# Patient Record
Sex: Female | Born: 1970 | Race: White | Hispanic: No | Smoking: Former smoker
Health system: Southern US, Community
[De-identification: ages and names within clinical notes are randomized; demographics above are authoritative.]

## PROBLEM LIST (undated history)

## (undated) DIAGNOSIS — R42 Dizziness and giddiness: Secondary | ICD-10-CM

## (undated) DIAGNOSIS — J449 Chronic obstructive pulmonary disease, unspecified: Secondary | ICD-10-CM

## (undated) DIAGNOSIS — I82419 Acute embolism and thrombosis of unspecified femoral vein: Secondary | ICD-10-CM

## (undated) DIAGNOSIS — I2699 Other pulmonary embolism without acute cor pulmonale: Secondary | ICD-10-CM

## (undated) HISTORY — PX: CHOLECYSTECTOMY: SHX55

## (undated) HISTORY — DX: Acute embolism and thrombosis of unspecified femoral vein: I82.419

## (undated) HISTORY — DX: Other pulmonary embolism without acute cor pulmonale: I26.99

## (undated) HISTORY — PX: OTHER SURGICAL HISTORY: SHX169

---

## 2002-02-24 DIAGNOSIS — I82419 Acute embolism and thrombosis of unspecified femoral vein: Secondary | ICD-10-CM

## 2002-02-24 HISTORY — DX: Acute embolism and thrombosis of unspecified femoral vein: I82.419

## 2011-04-02 ENCOUNTER — Emergency Department: Payer: Self-pay | Admitting: Emergency Medicine

## 2011-04-02 LAB — PROTIME-INR
INR: 0.9
Prothrombin Time: 12.1 secs (ref 11.5–14.7)

## 2011-04-02 LAB — APTT: Activated PTT: 27.5 secs (ref 23.6–35.9)

## 2013-03-29 ENCOUNTER — Emergency Department: Payer: Self-pay | Admitting: Emergency Medicine

## 2013-03-29 LAB — PROTIME-INR
INR: 1.5
Prothrombin Time: 17.6 secs — ABNORMAL HIGH (ref 11.5–14.7)

## 2013-03-29 LAB — COMPREHENSIVE METABOLIC PANEL
ALK PHOS: 78 U/L
ANION GAP: 8 (ref 7–16)
Albumin: 3.6 g/dL (ref 3.4–5.0)
BUN: 9 mg/dL (ref 7–18)
Bilirubin,Total: 0.5 mg/dL (ref 0.2–1.0)
CHLORIDE: 107 mmol/L (ref 98–107)
Calcium, Total: 8.3 mg/dL — ABNORMAL LOW (ref 8.5–10.1)
Co2: 23 mmol/L (ref 21–32)
Creatinine: 0.69 mg/dL (ref 0.60–1.30)
EGFR (African American): >60
Glucose: 93 mg/dL (ref 65–99)
OSMOLALITY: 274 (ref 275–301)
POTASSIUM: 3.7 mmol/L (ref 3.5–5.1)
SGOT(AST): 28 U/L (ref 15–37)
SGPT (ALT): 41 U/L (ref 12–78)
SODIUM: 138 mmol/L (ref 136–145)
Total Protein: 7.4 g/dL (ref 6.4–8.2)

## 2013-03-29 LAB — CBC
HCT: 38.7 % (ref 35.0–47.0)
HGB: 12.8 g/dL (ref 12.0–16.0)
MCH: 29 pg (ref 26.0–34.0)
MCHC: 33.1 g/dL (ref 32.0–36.0)
MCV: 88 fL (ref 80–100)
PLATELETS: 276 10*3/uL (ref 150–440)
RBC: 4.41 10*6/uL (ref 3.80–5.20)
RDW: 14.2 % (ref 11.5–14.5)
WBC: 7.2 10*3/uL (ref 3.6–11.0)

## 2013-03-29 LAB — TROPONIN I
Troponin-I: <0.02 ng/mL
Troponin-I: <0.02 ng/mL

## 2013-06-24 HISTORY — PX: TOE SURGERY: SHX1073

## 2013-07-29 ENCOUNTER — Inpatient Hospital Stay: Payer: Self-pay | Admitting: Internal Medicine

## 2013-07-29 LAB — TSH: Thyroid Stimulating Horm: 2.08 u[IU]/mL

## 2013-07-29 LAB — BASIC METABOLIC PANEL
Anion Gap: 6 — ABNORMAL LOW (ref 7–16)
BUN: 7 mg/dL (ref 7–18)
Calcium, Total: 8.5 mg/dL (ref 8.5–10.1)
Chloride: 106 mmol/L (ref 98–107)
Co2: 27 mmol/L (ref 21–32)
Creatinine: 0.76 mg/dL (ref 0.60–1.30)
EGFR (African American): >60
EGFR (Non-African Amer.): >60
GLUCOSE: 95 mg/dL (ref 65–99)
Osmolality: 275 (ref 275–301)
Potassium: 3.9 mmol/L (ref 3.5–5.1)
SODIUM: 139 mmol/L (ref 136–145)

## 2013-07-29 LAB — CBC WITH DIFFERENTIAL/PLATELET
BASOS ABS: 0.1 10*3/uL (ref 0.0–0.1)
Basophil %: 1 %
EOS PCT: 2.5 %
Eosinophil #: 0.1 10*3/uL (ref 0.0–0.7)
HCT: 37.6 % (ref 35.0–47.0)
HGB: 12.4 g/dL (ref 12.0–16.0)
LYMPHS ABS: 1.4 10*3/uL (ref 1.0–3.6)
LYMPHS PCT: 27.4 %
MCH: 29.4 pg (ref 26.0–34.0)
MCHC: 32.8 g/dL (ref 32.0–36.0)
MCV: 90 fL (ref 80–100)
Monocyte #: 0.4 x10 3/mm (ref 0.2–0.9)
Monocyte %: 7.9 %
Neutrophil #: 3.2 10*3/uL (ref 1.4–6.5)
Neutrophil %: 61.2 %
PLATELETS: 298 10*3/uL (ref 150–440)
RBC: 4.2 10*6/uL (ref 3.80–5.20)
RDW: 14.4 % (ref 11.5–14.5)
WBC: 5.2 10*3/uL (ref 3.6–11.0)

## 2013-07-29 LAB — PROTIME-INR
INR: 2.1
PROTHROMBIN TIME: 23.2 s — AB (ref 11.5–14.7)

## 2013-07-29 LAB — MAGNESIUM: MAGNESIUM: 2 mg/dL

## 2013-07-29 LAB — HEMOGLOBIN A1C: Hemoglobin A1C: 6.4 % — ABNORMAL HIGH (ref 4.2–6.3)

## 2013-07-29 LAB — APTT: ACTIVATED PTT: 55.3 s — AB (ref 23.6–35.9)

## 2013-07-30 LAB — BASIC METABOLIC PANEL
ANION GAP: 5 — AB (ref 7–16)
BUN: 8 mg/dL (ref 7–18)
CALCIUM: 8.4 mg/dL — AB (ref 8.5–10.1)
CHLORIDE: 105 mmol/L (ref 98–107)
CREATININE: 0.8 mg/dL (ref 0.60–1.30)
Co2: 27 mmol/L (ref 21–32)
EGFR (African American): >60
EGFR (Non-African Amer.): >60
Glucose: 81 mg/dL (ref 65–99)
Osmolality: 271 (ref 275–301)
POTASSIUM: 4.2 mmol/L (ref 3.5–5.1)
Sodium: 137 mmol/L (ref 136–145)

## 2013-07-30 LAB — LIPID PANEL
Cholesterol: 202 mg/dL — ABNORMAL HIGH (ref 0–200)
HDL: 38 mg/dL — AB (ref 40–60)
Ldl Cholesterol, Calc: 124 mg/dL — ABNORMAL HIGH (ref 0–100)
Triglycerides: 201 mg/dL — ABNORMAL HIGH (ref 0–200)
VLDL Cholesterol, Calc: 40 mg/dL (ref 5–40)

## 2013-07-30 LAB — CBC WITH DIFFERENTIAL/PLATELET
BASOS PCT: 1.1 %
Basophil #: 0.1 10*3/uL (ref 0.0–0.1)
Eosinophil #: 0.2 10*3/uL (ref 0.0–0.7)
Eosinophil %: 3.6 %
HCT: 38 % (ref 35.0–47.0)
HGB: 12.6 g/dL (ref 12.0–16.0)
Lymphocyte #: 1.7 10*3/uL (ref 1.0–3.6)
Lymphocyte %: 34.1 %
MCH: 29.7 pg (ref 26.0–34.0)
MCHC: 33.1 g/dL (ref 32.0–36.0)
MCV: 90 fL (ref 80–100)
MONOS PCT: 8.7 %
Monocyte #: 0.4 x10 3/mm (ref 0.2–0.9)
Neutrophil #: 2.6 10*3/uL (ref 1.4–6.5)
Neutrophil %: 52.5 %
Platelet: 282 10*3/uL (ref 150–440)
RBC: 4.24 10*6/uL (ref 3.80–5.20)
RDW: 14.3 % (ref 11.5–14.5)
WBC: 4.9 10*3/uL (ref 3.6–11.0)

## 2013-07-30 LAB — PROTIME-INR
INR: 1.9
Prothrombin Time: 21.4 secs — ABNORMAL HIGH (ref 11.5–14.7)

## 2013-07-31 LAB — PROTIME-INR
INR: 1.9
PROTHROMBIN TIME: 21.2 s — AB (ref 11.5–14.7)

## 2013-08-03 LAB — CULTURE, BLOOD (SINGLE)

## 2013-12-07 LAB — HEPATIC FUNCTION PANEL
ALK PHOS: 74 U/L (ref 25–125)
ALT: 23 U/L (ref 7–35)
AST: 25 U/L (ref 13–35)
BILIRUBIN, TOTAL: 1 mg/dL

## 2013-12-07 LAB — LIPID PANEL
CHOLESTEROL: 199 mg/dL (ref 0–200)
HDL: 50 mg/dL (ref 35–70)
LDL Cholesterol: 120 mg/dL
TRIGLYCERIDES: 147 mg/dL (ref 40–160)

## 2013-12-07 LAB — TSH: TSH: 2.79 u[IU]/mL (ref 0.41–5.90)

## 2013-12-07 LAB — POCT ERYTHROCYTE SEDIMENTATION RATE, NON-AUTOMATED: Sed Rate: 17 mm

## 2013-12-07 LAB — BASIC METABOLIC PANEL
BUN: 7 mg/dL (ref 4–21)
CREATININE: 0.6 mg/dL (ref 0.5–1.1)
GLUCOSE: 85 mg/dL
SODIUM: 137 mmol/L (ref 137–147)

## 2013-12-07 LAB — CBC AND DIFFERENTIAL
NEUTROS ABS: 3 /uL
WBC: 5.5 10*3/mL

## 2013-12-22 ENCOUNTER — Ambulatory Visit: Payer: Self-pay | Admitting: Internal Medicine

## 2014-01-17 LAB — PROTIME-INR: PROTIME: 10.8 s (ref 10.0–13.8)

## 2014-01-17 LAB — POCT INR: INR: 1 (ref 0.9–1.1)

## 2014-06-17 NOTE — H&P (Signed)
PATIENT NAME:  Angel Campbell, Angel Campbell MR#:  [id number] DATE OF BIRTH:  Apr 12, 1970  DATE OF ADMISSION:  07/29/2013  PRIMARY CARE PHYSICIAN: [facility]   REFERRING PHYSICIAN: Dr. York Cerise  CHIEF COMPLAINT: Rash on legs.  HISTORY OF PRESENT ILLNESS: The patient is a 44 year old female with past medical history of DVT and pulmonary embolism taking Coumadin on a regular basis who is coming to the ER with the chief complaint of rash on her bilateral lower extremities for the past 3 to 4 days. The patient is reporting that she went to the beach last week and then she had a severe sunburn on her bilateral lower extremities, on his back and also on upper extremities. The degree of sunburn is quite severe on her bilateral lower extremities. The patient has noticed some blisters and eventually the rash is getting worse, which made her come to the ER. Denies any fever. White count is not elevated. The patient was given IV Rocephin and vancomycin and hospitalist team is called to admit the patient for sunburn with superimposed cellulitis. The patient's INR is therapeutic at 2.1. The patient denies any chest pain or shortness of breath. No other complaints.   PAST MEDICAL HISTORY: Lower extremity DVT and pulmonary embolism, on Coumadin, and obesity.   PAST SURGICAL HISTORY: Cholecystectomy, ingrown toenail removal.  ALLERGIES: No known drug allergies.  PSYCHOSOCIAL HISTORY: Lives at home with son. No history of smoking, alcohol or illicit drug usage.   FAMILY HISTORY: Mom has history of chronic atrial fibrillation and on Coumadin.  HOME MEDICATIONS: Coumadin 8 mg p.o. once daily during the week and 10 mg on Sundays.  REVIEW OF SYSTEMS: CONSTITUTIONAL: Denies any fever or fatigue.  EYES: Denies blurry vision, double vision.  ENT: Denies epistaxis, discharge, tinnitus.  RESPIRATORY: Denies cough, COPD. Has history of pulmonary embolism.  CARDIOVASCULAR: No chest pain, palpitations. GASTROINTESTINAL: Denies  nausea, vomiting, diarrhea, abdominal pain. No hematemesis. No melena. NEUROLOGIC: Denies any vertigo, ataxia.  PSYCHIATRIC: Denies any ADD, OCD, anxiety. MUSCULOSKELETAL: Denies any gout, arthritis.  ENDOCRINE: Denies polyuria, nocturia. Denies any heat or cold intolerance.   PHYSICAL EXAMINATION: VITAL SIGNS: Temperature 97.5, pulse 62, respirations 18, blood pressure 116/80, pulse ox 99%.  GENERAL APPEARANCE: Not in acute distress. Moderately built and nourished.  HEENT: Normocephalic, atraumatic. Pupils are equally reacting to light and accommodation. No scleral icterus. No conjunctival injection. No sinus tenderness. No postnasal drip. Moist mucous membranes.  NECK: Supple. No JVD or thyromegaly. Range of motion is intact.  LUNGS: Clear to auscultation bilaterally. No accessory muscle usage. No anterior chest wall tenderness on palpation.  CARDIAC: S1 and S2 normal. Regular rate and rhythm. No murmurs.  ABDOMEN: Soft. Bowel sounds are positive in all 4 quadrants. Nontender, nondistended. No hepatosplenomegaly. No masses felt.  NEUROLOGIC: Awake and oriented x3. Cranial nerves II through XII are grossly intact. Motor and sensory are intact. Reflexes are 2+.  EXTREMITIES: Erythematous petechial rash which is nonblanching with superficial blisters which seem to be non-infected with clear fluid extending to the thigh area bilaterally. Lower extremities no edema. No cyanosis. No clubbing.  SKIN: Warm to touch. Normal turgor. No lesions. Rash as described above on the lower extremities.  PSYCHIATRIC: Normal mood and affect.   DIAGNOSTIC DATA: CBC normal. PT 23. INR 2.1. Activated PTT 55.3. Chem-8 is normal, except anion gap at 6.  ASSESSMENT AND PLAN: A 44 year old female presenting to the ER with the chief complaint of worsening of rash on her bilateral lower extremities with superficial blisters.  Will be admitted with the following assessment and plan:  1.  Bilateral lower extremity rash from  severe sunburn with superficial blisters and superimposed cellulitis. Will admit her to med/surg unit. We will provide her silver sulfadiazine topical cream 3 times a day to the effected areas. Antibiotics with IV Zosyn and vancomycin for superimposed cellulitis.  2.  History of deep venous thrombosis and pulmonary embolism. Continue Coumadin. INR is therapeutic. Check PT- INR. 3.  Obesity. The patient will be benefited with low fat diet and exercise.  4.  Provide gastrointestinal prophylaxis. Deep vein thrombosis prophylaxis is not needed as the patient's INR is therapeutic. Continue Coumadin.  FULL code.   Diagnosis and plan of care was discussed in detail with the patient. She is aware of the plan.   TOTAL TIME SPENT ON ADMISSION: 50 minutes.    ____________________________ Angel LabAruna Winford Hehn, MD ag:sb D: 07/29/2013 07:03:24 ET T: 07/29/2013 07:23:33 ET JOB#: [id number]  cc: Angel LabAruna Keyonta Barradas, MD, <Dictator> Angel LabARUNA Tawan Corkern MD ELECTRONICALLY SIGNED 08/01/2013 0:59

## 2014-06-17 NOTE — Discharge Summary (Signed)
PATIENT NAME:  Angel Campbell, Angel Campbell MR#:  [id number] DATE OF BIRTH:  [birth year]  DATE OF ADMISSION:  07/29/2013 DATE OF DISCHARGE:  07/31/2013  ADMITTING PHYSICIAN: Dr. Amado CoeGouru.   DISCHARGING PHYSICIAN: Dr. Enid Baasadhika Shabnam Ladd.   PRIMARY CARE PHYSICIAN: None.   CONSULTATIONS IN THE HOSPITAL: None.   DISCHARGE DIAGNOSES:  1. Bilateral lower extremity cellulitis on top of skin dermatitis.  2. History of deep vein thrombosis.  3. Hyperlipidemia.   DISCHARGE MEDICATIONS:  1. Coumadin 8 mg p.o. daily except Sundays.  2. Coumadin 10 mg p.o. daily on Sundays.  3. Doxycycline 100 mg p.o. b.i.d. for 10 days.  4. Percocet 5/325 mg 1 tablet q.8 hours p.r.n. for pain.  5. Silver sulfadiazine 1% topical cream applied topically 3 times a day for 7 both legs.    DISCHARGE DIET: Low-fat diet.   DISCHARGE ACTIVITY: As tolerated.    FOLLOWUP INSTRUCTIONS: PCP followup in 1 week if no improvement or can follow up with [facility] as well.   LABS AND IMAGING STUDIES PRIOR TO DISCHARGE: INR is 1.9. WBC 4.9, hemoglobin 12.6, hematocrit 38.0, platelet count 282.   Sodium 137, potassium 4.2, chloride 105, bicarb 27, BUN 8, creatinine 0.8, glucose 81 and calcium of 8.4.   LDL cholesterol 124, triglycerides 201, total cholesterol 202 and HDL of 38.   Bilateral lower extremity Doppler showing no evidence of any deep venous thrombosis.   Blood cultures remained negative.   BRIEF HOSPITAL COURSE: Angel Campbell is a 44 year old, obese, Caucasian female with past medical history significant for history of DVT in lower extremity on Coumadin, comes to the hospital secondary to bilateral lower extremity erythema.   1. Bilateral lower extremity cellulitis: Also, the patient exposed to sun so could be possible sunburn, too. She has minimal pain. Especially pain worsens when walking. She has minimal erythema. She was on Ancef in the hospital and is being discharged on doxycycline. Advised to follow up with PCP or  dermatology in 1 week if symptoms do not improve. She is also on silver sulfadiazine cream in the hospital which seemed like improving her redness, so she is being discharged on the same and some pain medications.  2. Hyperlipemia: Cholesterol elevated. She is obese. She could benefit from weight loss and increased physical activity. So, not being started on any medications and repeat lipid profile in 6 months might help to see if she needs medications or not.   Her course has been otherwise uneventful in the hospital.   DISCHARGE CONDITION: Stable.   DISCHARGE DISPOSITION: Home.   TIME SPENT ON DISCHARGE: 40 minutes.   ____________________________ Angel Baasadhika Tonishia Steffy, MD rk:gb D: 07/31/2013 12:09:11 ET T: 07/31/2013 23:36:37 ET JOB#: [id number]  cc: Angel Baasadhika Verdis Bassette, MD, <Dictator> Angel BaasADHIKA Braxtin Bamba MD ELECTRONICALLY SIGNED 08/06/2013 14:26

## 2014-12-13 ENCOUNTER — Encounter: Payer: Self-pay | Admitting: *Deleted

## 2014-12-13 ENCOUNTER — Emergency Department: Payer: BLUE CROSS/BLUE SHIELD

## 2014-12-13 DIAGNOSIS — W1839XA Other fall on same level, initial encounter: Secondary | ICD-10-CM | POA: Diagnosis not present

## 2014-12-13 DIAGNOSIS — S8992XA Unspecified injury of left lower leg, initial encounter: Secondary | ICD-10-CM | POA: Diagnosis not present

## 2014-12-13 DIAGNOSIS — Y998 Other external cause status: Secondary | ICD-10-CM | POA: Diagnosis not present

## 2014-12-13 DIAGNOSIS — Y9289 Other specified places as the place of occurrence of the external cause: Secondary | ICD-10-CM | POA: Insufficient documentation

## 2014-12-13 DIAGNOSIS — Y9389 Activity, other specified: Secondary | ICD-10-CM | POA: Diagnosis not present

## 2014-12-13 NOTE — ED Notes (Addendum)
Pt has left knee pain.  Pt states she walking and left knee popped and she fell tonight at 2130.  Painful to ambulate. Hx of dvt.

## 2014-12-13 NOTE — ED Notes (Signed)
Per dr Alphonzo Lemmingsmcshane, only xray at this time.

## 2014-12-14 ENCOUNTER — Emergency Department
Admission: EM | Admit: 2014-12-14 | Discharge: 2014-12-14 | Disposition: A | Discharge status: Home / Self Care | Payer: BLUE CROSS/BLUE SHIELD | Attending: Emergency Medicine | Admitting: Emergency Medicine

## 2014-12-14 ENCOUNTER — Emergency Department: Payer: BLUE CROSS/BLUE SHIELD

## 2014-12-14 DIAGNOSIS — M25562 Pain in left knee: Secondary | ICD-10-CM

## 2014-12-14 MED ORDER — OXYCODONE-ACETAMINOPHEN 5-325 MG PO TABS
2.0000 | ORAL_TABLET | Freq: Once | ORAL | Status: AC
  Administered 2014-12-14: 2 via ORAL
  Filled 2014-12-14: qty 2

## 2014-12-14 MED ORDER — OXYCODONE-ACETAMINOPHEN 5-325 MG PO TABS: 1.0000 | ORAL_TABLET | ORAL | Status: DC | PRN

## 2014-12-14 NOTE — ED Notes (Signed)
Pt. Going home by self.  Pt. Will follow up with orthopedic doctor.

## 2014-12-14 NOTE — ED Provider Notes (Signed)
[facility] Emergency Department Provider Note  ____________________________________________  Time seen: 3:00 AM  I have reviewed the triage vital signs and the nursing notes.   HISTORY  Chief Complaint Knee Injury      HPI Angel Campbell is a 44 y.o. female presents with acute onset of left knee pain proximally 9:30 last night. Patient states while ambulating she felt a "pop in her knee and so slowly fell to her knee. Patient states current pain score 7 out of 10 located in the anterior portion of the knee. Of note patient has a history of DVTs which were diagnosed in 2004 for which she's been on anticoagulation. Patient states however that she's been off her anticoagulation for few weeks secondary to her doctor relocating.    Past medical history DVT There are no active problems to display for this patient.   Past surgical history None No current outpatient prescriptions on file.  Allergies No known drug allergies No family history on file.  Social History Social History  Substance Use Topics  . Smoking status: Never Smoker   . Smokeless tobacco: Not on file  . Alcohol Use: No    Review of Systems  Constitutional: Negative for fever. Eyes: Negative for visual changes. ENT: Negative for sore throat. Cardiovascular: Negative for chest pain. Respiratory: Negative for shortness of breath. Gastrointestinal: Negative for abdominal pain, vomiting and diarrhea. Genitourinary: Negative for dysuria. Musculoskeletal: Negative for back pain. Positive for left knee pain. Skin: Negative for rash. Neurological: Negative for headaches, focal weakness or numbness.   10-point ROS otherwise negative.  ____________________________________________   PHYSICAL EXAM:  VITAL SIGNS: ED Triage Vitals  Enc Vitals Group     BP 12/13/14 2313 143/76 mmHg     Pulse Rate 12/13/14 2313 82     Resp 12/14/14 0423 16     Temp 12/13/14 2313 98.1 F (36.7 C)     Temp Source 12/13/14 2313 Oral     SpO2 12/13/14 2313 97 %     Weight --      Height --      Head Cir --      Peak Flow --      Pain Score 12/13/14 2317 8     Pain Loc --      Pain Edu? --      Excl. in GC? --      Constitutional: Alert and oriented. Well appearing and in no distress. Eyes: Conjunctivae are normal. PERRL. Normal extraocular movements. ENT   Head: Normocephalic and atraumatic.   Nose: No congestion/rhinnorhea.   Mouth/Throat: Mucous membranes are moist.   Neck: No stridor. Hematological/Lymphatic/Immunilogical: No cervical lymphadenopathy. Cardiovascular: Normal rate, regular rhythm. Normal and symmetric distal pulses are present in all extremities. No murmurs, rubs, or gallops. Respiratory: Normal respiratory effort without tachypnea nor retractions. Breath sounds are clear and equal bilaterally. No wheezes/rales/rhonchi. Gastrointestinal: Soft and nontender. No distention. There is no CVA tenderness. Genitourinary: deferred Musculoskeletal: Anterior left knee tenderness to palpation. Positive anterior posterior drawer test. Pain with palpation of the patient's calf.. No joint effusions.  No lower extremity tenderness nor edema. Neurologic:  Normal speech and language. No gross focal neurologic deficits are appreciated. Speech is normal.  Skin:  Skin is warm, dry and intact. No rash noted. Psychiatric: Mood and affect are normal. Speech and behavior are normal. Patient exhibits appropriate insight and judgment.  ____________________________________________      RADIOLOGY    DG Knee Complete 4 Views Left (Final result) Result  time: 12/14/14 00:07:55   Final result by Rad Results In Interface (12/14/14 00:07:55)   Narrative:   CLINICAL DATA: Status post fall; left knee popped, with left knee pain. Initial encounter.  EXAM: LEFT KNEE - COMPLETE 4+ VIEW  COMPARISON: None.  FINDINGS: There is no evidence of fracture or dislocation. The  joint spaces are preserved. Small marginal osteophytes are seen arising at all 3 compartments. A few apparent loose bodies are seen about the joint space. A fabella is noted.  A small to moderate knee joint effusion is noted. The visualized soft tissues are otherwise unremarkable in appearance.  IMPRESSION: 1. No evidence of fracture or dislocation. 2. Small to moderate knee joint effusion noted. If the patient's symptoms persist, MRI would be helpful for further evaluation, to assess for underlying internal derangement. 3. Mild tricompartmental osteoarthritis noted. Few apparent loose bodies seen about the joint space.   Electronically Signed By: Roanna Raider M.D. On: 12/14/2014 00:07            INITIAL IMPRESSION / ASSESSMENT AND PLAN / ED COURSE  Pertinent labs & imaging results that were available during my care of the patient were reviewed by me and considered in my medical decision making (see chart for details).  Given history of physical exam concern for internal derangement of the knee. As such knee immobilizer applied patient received Percocet for pain we'll strongly recommend patient follow-up with orthopedic surgeon on call Dr. Hyacinth Meeker for MRI for further evaluation and management.  ____________________________________________   FINAL CLINICAL IMPRESSION(S) / ED DIAGNOSES  Final diagnoses:  Left knee pain      [name], MD 12/14/14 [phone]

## 2014-12-14 NOTE — ED Notes (Signed)
Patient transported to Ultrasound 

## 2014-12-14 NOTE — Discharge Instructions (Signed)

## 2014-12-14 NOTE — ED Notes (Signed)
Patient states knee is painful but hurts to bear weight.

## 2015-05-26 ENCOUNTER — Emergency Department: Payer: BLUE CROSS/BLUE SHIELD

## 2015-05-26 ENCOUNTER — Encounter: Payer: Self-pay | Admitting: Emergency Medicine

## 2015-05-26 ENCOUNTER — Emergency Department
Admission: EM | Admit: 2015-05-26 | Discharge: 2015-05-26 | Disposition: A | Discharge status: Home / Self Care | Payer: BLUE CROSS/BLUE SHIELD | Attending: Emergency Medicine | Admitting: Emergency Medicine

## 2015-05-26 DIAGNOSIS — Z87891 Personal history of nicotine dependence: Secondary | ICD-10-CM | POA: Insufficient documentation

## 2015-05-26 DIAGNOSIS — J449 Chronic obstructive pulmonary disease, unspecified: Secondary | ICD-10-CM | POA: Diagnosis not present

## 2015-05-26 DIAGNOSIS — H81391 Other peripheral vertigo, right ear: Secondary | ICD-10-CM | POA: Diagnosis not present

## 2015-05-26 DIAGNOSIS — R42 Dizziness and giddiness: Secondary | ICD-10-CM | POA: Diagnosis present

## 2015-05-26 HISTORY — DX: Dizziness and giddiness: R42

## 2015-05-26 HISTORY — DX: Chronic obstructive pulmonary disease, unspecified: J44.9

## 2015-05-26 LAB — BASIC METABOLIC PANEL
ANION GAP: 4 — AB (ref 5–15)
BUN: 13 mg/dL (ref 6–20)
CALCIUM: 9 mg/dL (ref 8.9–10.3)
CO2: 26 mmol/L (ref 22–32)
CREATININE: 0.65 mg/dL (ref 0.44–1.00)
Chloride: 105 mmol/L (ref 101–111)
Glucose, Bld: 112 mg/dL — ABNORMAL HIGH (ref 65–99)
Potassium: 4 mmol/L (ref 3.5–5.1)
SODIUM: 135 mmol/L (ref 135–145)

## 2015-05-26 LAB — CBC
HCT: 42.2 % (ref 35.0–47.0)
HEMOGLOBIN: 14.4 g/dL (ref 12.0–16.0)
MCH: 29.6 pg (ref 26.0–34.0)
MCHC: 34 g/dL (ref 32.0–36.0)
MCV: 87.2 fL (ref 80.0–100.0)
PLATELETS: 234 10*3/uL (ref 150–440)
RBC: 4.84 MIL/uL (ref 3.80–5.20)
RDW: 14 % (ref 11.5–14.5)
WBC: 5.9 10*3/uL (ref 3.6–11.0)

## 2015-05-26 LAB — TROPONIN I

## 2015-05-26 MED ORDER — MECLIZINE HCL 25 MG PO TABS
25.0000 mg | ORAL_TABLET | Freq: Once | ORAL | Status: AC
  Administered 2015-05-26: 25 mg via ORAL
  Filled 2015-05-26: qty 1

## 2015-05-26 MED ORDER — MECLIZINE HCL 25 MG PO TABS: 25.0000 mg | ORAL_TABLET | Freq: Three times a day (TID) | ORAL | Status: DC | PRN

## 2015-05-26 NOTE — ED Notes (Signed)
Pt states history of vertigo. Pt states since 05/23/2015 has been dizzy. Pt with cms intact in all extremities, strong equal grips bilateral upper extremities, symmetrical face. Pt states yesterday had nausea and emesis x1. Pt speaking with rapid clear speech. Pt states has had headache.

## 2015-05-26 NOTE — ED Provider Notes (Signed)
[facility] Emergency Department Provider Note  ____________________________________________  Time seen: Approximately 3:36 AM  I have reviewed the triage vital signs and the nursing notes.   HISTORY  Chief Complaint Dizziness    HPI Angel Campbell is a 45 y.o. female history of vertigo, COPD.  Patient reports that about 3 or 4 days ago she had a slight "cold" with a runny nose and some congested feeling in her right ear. This has improved, but she started to develop dizziness which she describes as feeling like previous "vertigo" has been ongoing for the last 3 days. No numbness tingling weakness, facial droop, trouble speaking, or other new concerns. Denies any fever or headache. The present time she states she feels well except she notes especially when she goes to sit up from bed or turns quickly that she'll start feeling a spinning sensation that will last for a few seconds. No falls or injury.  Same symptoms previously treated with Antivert with good success.  She denies pregnancy, reports that she has not had intercourse in well over 2 years.   Past Medical History  Diagnosis Date  . Vertigo   . COPD (chronic obstructive pulmonary disease) (HCC)     There are no active problems to display for this patient.   Past Surgical History  Procedure Laterality Date  . Cesarean section    . Arm surgery      Current Outpatient Rx  Name  Route  Sig  Dispense  Refill  . meclizine (ANTIVERT) 25 MG tablet   Oral   Take 1 tablet (25 mg total) by mouth 3 (three) times daily as needed for dizziness.   30 tablet   0     Allergies Review of patient's allergies indicates no known allergies.  History reviewed. No pertinent family history.  Social History Social History  Substance Use Topics  . Smoking status: Former Games developer  . Smokeless tobacco: Never Used  . Alcohol Use: No    Review of Systems Constitutional: No fever/chills Eyes: No visual  changes. ENT: No sore throat. Cardiovascular: Denies chest pain. Respiratory: Denies shortness of breath. Gastrointestinal: No abdominal pain.  No nausea, no vomiting.  No diarrhea.  No constipation. Genitourinary: Negative for dysuria. Musculoskeletal: Negative for back pain. Skin: Negative for rash. Neurological: Negative for headaches, focal weakness or numbness.  10-point ROS otherwise negative.  ____________________________________________   PHYSICAL EXAM:  VITAL SIGNS: ED Triage Vitals  Enc Vitals Group     BP 05/26/15 0036 137/82 mmHg     Pulse Rate 05/26/15 0036 76     Resp 05/26/15 0036 18     Temp 05/26/15 0036 98.5 F (36.9 C)     Temp Source 05/26/15 0036 Oral     SpO2 05/26/15 0036 100 %     Weight 05/26/15 0036 300 lb (136.079 kg)     Height 05/26/15 0036  (1.753 m)     Head Cir --      Peak Flow --      Pain Score --      Pain Loc --      Pain Edu? --      Excl. in GC? --    Constitutional: Alert and oriented. Well appearing and in no acute distress. Eyes: Conjunctivae are normal. PERRL. EOMI. Head: Atraumatic. Tympanic membranes normal bilateral. Nose: No congestion/rhinnorhea. Mouth/Throat: Mucous membranes are moist.  Oropharynx non-erythematous. Neck: No stridor.   Cardiovascular: Normal rate, regular rhythm. Grossly normal heart sounds.  Good  peripheral circulation. Respiratory: Normal respiratory effort.  No retractions. Lungs CTAB. Gastrointestinal: Soft and nontender. No distention.  Musculoskeletal: No lower extremity tenderness nor edema.  No joint effusions. No lower extremity edema.  Patient tells me she has a history of pulmonary embolism, previous DVTs and was supposed to be on anticoagulation but stopped abruptly about 3 months ago. She had been on Coumadin for the last 12 years, but stopped due to changes in insurance. She is not having any chest pain, has not noticed a swelling in her legs, does not have any shortness of  breath.  Neurologic:  Normal speech and language. No gross focal neurologic deficits are appreciated. Sits up slowly from bed, very intentional and how she moves her head trying to keep centered and not move her head quickly.  NIH score equals 0, performed by me at bedside. The patient has no pronator drift. The patient has normal cranial nerve exam. Extraocular movements are normal. Visual fields are normal. Patient has 5 out of 5 strength in all extremities. There is no numbness or gross, acute sensory abnormality in the extremities bilaterally. No speech disturbance. No dysarthria. No aphasia. No ataxia. Normal finger nose finger bilat. Patient speaking in full and clear sentences.   Walks with normal gait  Skin:  Skin is warm, dry and intact. No rash noted. Psychiatric: Mood and affect are normal. Speech and behavior are normal.  ____________________________________________   LABS (all labs ordered are listed, but only abnormal results are displayed)  Labs Reviewed  BASIC METABOLIC PANEL - Abnormal; Notable for the following:    Glucose, Bld 112 (*)    Anion gap 4 (*)    All other components within normal limits  CBC  TROPONIN I   ____________________________________________  EKG  ED ECG REPORT I, Tiarah Shisler, the attending physician, personally viewed and interpreted this ECG.  Date: 05/26/2015 EKG Time: 0050 Rate: 60 Rhythm: normal sinus rhythm QRS Axis: normal Intervals: normal ST/T Wave abnormalities: normal Conduction Disturbances: none Narrative Interpretation: unremarkable  ____________________________________________  RADIOLOGY  CT Head Wo Contrast (Final result) Result time: 05/26/15 01:39:45   Final result by Rad Results In Interface (05/26/15 01:39:45)   Narrative:   CLINICAL DATA: Vertigo beginning May 23, 2015. Nausea and vomiting yesterday.  EXAM: CT HEAD WITHOUT CONTRAST  TECHNIQUE: Contiguous axial images were obtained  from the base of the skull through the vertex without intravenous contrast.  COMPARISON: None.  FINDINGS: INTRACRANIAL CONTENTS: The ventricles and sulci are normal. No intraparenchymal hemorrhage, mass effect nor midline shift. No acute large vascular territory infarcts. No abnormal extra-axial fluid collections. Basal cisterns are patent.  ORBITS: The included ocular globes and orbital contents are normal.  SINUSES: The mastoid aircells and included paranasal sinuses are well-aerated.  SKULL/SOFT TISSUES: No skull fracture. No significant soft tissue swelling.  IMPRESSION: No acute intracranial process; negative CT head.   Electronically Signed By: Awilda Metro M.D. On: 05/26/2015 01:39      ____________________________________________  PROCEDURES  Procedure(s) performed: None  Critical Care performed: No   No cardiopulmonary symptoms. No acute neuro abnormalities. No clinical history or exam findings to suggest an active DVT.  ____________________________________________   INITIAL IMPRESSION / ASSESSMENT AND PLAN / ED COURSE  Pertinent labs & imaging results that were available during my care of the patient were reviewed by me and considered in my medical decision making (see chart for details).  She presents with episodes of dizziness. Appears to be likely peripheral vertigo probably brought about from  a recent URI that seems to be resolving. She does carry a history of vertigo, and her exam does not demonstrate acute neurologic abdomen relative suggests stroke or other central cause at this time. Labs reassuring.  Discussed with Dr. Merlene Pullingorcoran (Heme-Onc), given active vertigo, advises close outpatient follow-up with her Heme/Onc service. Given active vertigo, won't anticoagulate now, but advises close follow-up with her in the clinic. Discussed with the patient, she is agreeable to follow up with hematology oncology at next available appointment. We did  discuss that if she develops any sudden swelling her legs, pain in the leg, shortness of breath or chest pain she needs to come to the emergency room right away. However, after discussing with the patient, hematology, and shared medical decision making at the bedside will not start anticoagulation now because of her active vertigo and would hate for her to have a fall due to vertigo while on anticoagulation.  Patient agreeable to careful follow-up instructions, in addition she'll be followed closely with hematology oncology regarding her need for anticoagulation. ____________________________________________   FINAL CLINICAL IMPRESSION(S) / ED DIAGNOSES  Final diagnoses:  Vertigo, peripheral, right      Sharyn CreamerMark Marlin Jarrard, MD 05/26/15 [phone]

## 2015-05-26 NOTE — ED Notes (Signed)
Spoke with dr. Derrill KayGoodman regarding pt's chief complaint, dizziness, headache. Order for head ct received.

## 2015-05-26 NOTE — Discharge Instructions (Signed)
Please, it is very important you follow up with hematology oncology as soon as possible to discuss getting back on your anticoagulants like Coumadin. We will not place you on this now because of your risk of falling due to vertigo and our discussion of risks and benefits, but please call and schedule an appointment as soon as possible as you are at risk for developing further blood clots in need to have a discussion with hematology and oncology to be back on anticoagulation as soon as possible. Return to emergency room right away if he develop chest pain, shortness of breath, swelling in the leg, pain in the leg, or other new concerns.  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  Please read through the included information and take any prescribed medication(s).  Follow up with your doctor as listed above.  If you develop any new or worsening symptoms that concern you, including but not limited to persistent dizziness/vertigo, numbness or weakness in your arms or legs, altered mental status, persistent vomiting, or fever greater than 101, please return immediately to the Emergency Department.   Vertigo Vertigo means you feel like you or your surroundings are moving when they are not. Vertigo can be dangerous if it occurs when you are at work, driving, or performing difficult activities.  CAUSES  Vertigo occurs when there is a conflict of signals sent to your brain from the visual and sensory systems in your body. There are many different causes of vertigo, including:  Infections, especially in the inner ear.  A bad reaction to a drug or misuse of alcohol and medicines.  Withdrawal from drugs or alcohol.  Rapidly changing positions, such as lying down or rolling over in bed.  A migraine headache.  Decreased blood flow to the brain.  Increased pressure in the brain from a head injury, infection, tumor, or bleeding. SYMPTOMS  You may feel as though the world is spinning around or  you are falling to the ground. Because your balance is upset, vertigo can cause nausea and vomiting. You may have involuntary eye movements (nystagmus). DIAGNOSIS  Vertigo is usually diagnosed by physical exam. If the cause of your vertigo is unknown, your caregiver may perform imaging tests, such as an MRI scan (magnetic resonance imaging). TREATMENT  Most cases of vertigo resolve on their own, without treatment. Depending on the cause, your caregiver may prescribe certain medicines. If your vertigo is related to body position issues, your caregiver may recommend movements or procedures to correct the problem. In rare cases, if your vertigo is caused by certain inner ear problems, you may need surgery. HOME CARE INSTRUCTIONS   Follow your caregiver's instructions.  Avoid driving.  Avoid operating heavy machinery.  Avoid performing any tasks that would be dangerous to you or others during a vertigo episode.  Tell your caregiver if you notice that certain medicines seem to be causing your vertigo. Some of the medicines used to treat vertigo episodes can actually make them worse in some people. SEEK IMMEDIATE MEDICAL CARE IF:   Your medicines do not relieve your vertigo or are making it worse.  You develop problems with talking, walking, weakness, or using your arms, hands, or legs.  You develop severe headaches.  Your nausea or vomiting continues or gets worse.  You develop visual changes.  A family member notices behavioral changes.  Your condition gets worse. MAKE SURE YOU:  Understand these instructions.  Will watch your condition.  Will get help right away if  you are not doing well or get worse.   This information is not intended to replace advice given to you by your health care provider. Make sure you discuss any questions you have with your health care provider.   Document Released: 11/20/2004 Document Revised: 05/05/2011 Document Reviewed: 06/05/2014 Elsevier  Interactive Patient Education [employer]

## 2015-06-07 ENCOUNTER — Encounter: Payer: Self-pay | Admitting: Hematology and Oncology

## 2015-06-07 ENCOUNTER — Inpatient Hospital Stay: Payer: BLUE CROSS/BLUE SHIELD | Attending: Hematology and Oncology | Admitting: Hematology and Oncology

## 2015-06-07 ENCOUNTER — Other Ambulatory Visit: Payer: Self-pay | Admitting: Hematology and Oncology

## 2015-06-07 VITALS — BP 123/84 | HR 100 | Temp 98.7°F | Resp 20 | Wt 286.6 lb

## 2015-06-07 DIAGNOSIS — E041 Nontoxic single thyroid nodule: Secondary | ICD-10-CM | POA: Diagnosis not present

## 2015-06-07 DIAGNOSIS — Z86718 Personal history of other venous thrombosis and embolism: Secondary | ICD-10-CM | POA: Insufficient documentation

## 2015-06-07 DIAGNOSIS — R42 Dizziness and giddiness: Secondary | ICD-10-CM

## 2015-06-07 DIAGNOSIS — Z590 Homelessness: Secondary | ICD-10-CM | POA: Diagnosis not present

## 2015-06-07 DIAGNOSIS — Z87891 Personal history of nicotine dependence: Secondary | ICD-10-CM | POA: Diagnosis not present

## 2015-06-07 DIAGNOSIS — J449 Chronic obstructive pulmonary disease, unspecified: Secondary | ICD-10-CM | POA: Diagnosis not present

## 2015-06-07 DIAGNOSIS — I2699 Other pulmonary embolism without acute cor pulmonale: Secondary | ICD-10-CM

## 2015-06-07 DIAGNOSIS — I82403 Acute embolism and thrombosis of unspecified deep veins of lower extremity, bilateral: Secondary | ICD-10-CM | POA: Insufficient documentation

## 2015-06-07 DIAGNOSIS — Z79899 Other long term (current) drug therapy: Secondary | ICD-10-CM | POA: Diagnosis not present

## 2015-06-07 DIAGNOSIS — Z86711 Personal history of pulmonary embolism: Secondary | ICD-10-CM

## 2015-06-07 NOTE — Progress Notes (Signed)
[facility] day:  06/07/2015  Chief Complaint: Angel Campbell is a 45 y.o. 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 Jacqualine Code for assessment and management.  HPI: The history is provided by the patient (no records are available).  She states that she was diagnosed with bilateral lower extremity clots and bilateral pulmonary emboli in 2004 at the [facility]. She was subsequently transferred to [facility] for further care.  She notes her original hospitalization was for 3 months. She is unaware of any precipitating events.  She was doing marathon walks and exercising daily. Just prior to her clot, she had trauma to her left knee due to tripping after tripping over cats and mixing bleach and ammonia.   She also notes smoking one half pack a day.  She denies any use of birth control pills.  She states that her leg initially became red and she could walk on it. She develop chest pained and shortness of breath as well as a fever. She presented to medical attention.  She states that he had "no pulses in her left leg".  She did not have any thrombolysis.  Her legs were wrapped.   There was some discussion about a filter but none was placed. She was discharged on Coumadin.  She was told that she needed anti-coagulation for life.  She was followed by Dr. Laurence Spates in [place], [place].  She subsequently moved to [facility]. She was initially seen at [facility].  It has been 1 year since she has been seen by her primary care physician. She had refills on her Coumadin for 1 year.  She ran out of her Coumadin about 2 months ago. She states that she was taking Coumadin 10 mg a day. The highest dose of Coumadin she took in the past was 15 mg a day.  She presented to the emergency room on 05/26/2015.  She had vertigo with a slight runny nose and some  congestion in her right ear.  Previously, her symptoms were treated with Antivert with good success.  She denied any shortness of breath, chest pain, or lower extremity edema.  Head CT without contrast on 05/26/2015 revealed no intracranial process. She was referred by Dr. Delman Kitten in the emergency room to the hematology clinic as she had not been on anticoagulation in some time.  Symptomatically, she notes being dizzy for 2 weeks and unable to get out of bed. She has not been working. She notes intermittent sweats. She notes erratic menses with some spotting (none in 6 months).   She was last seen by gynecology about 11 years ago.  She notes some dizziness, nausea and change in vision like "looking through water". It has been a while since she's had an eye examination.  She has broken glasses.   She notes a goiter with a recent neck ultrasound.  She notes some shortness of breath with exertion and in the shower.  She had some vague abdominal symptoms, but better with "less food".  She has not had a colonoscopy in over 10 years.   She has never had a mammogram.  Review of imaging in Epic reveals the following:  Bilateral lower extremity duplex on 04/02/2011 revealed no DVT on the right, but a web formation involving the common femoral, superficial femoral and popliteal consistent with residual change from prior deep vein thrombosis.  Chest CT angiogram on 03/29/2013 revealed no pulmonary embolism, but a large left thyroid nodule.  Thyroid ultrasound on 12/22/2013 revealed a 3.8 cm left thyroid nodule.  Ultrasound guided biopsy was recommended.  Bilateral lower extremity duplex on 12/14/2014 revealed no evidence of DVT.   Past Medical History  Diagnosis Date  . Vertigo   . COPD (chronic obstructive pulmonary disease) ([place])   . 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 Cesarean section    . Arm surgery    . Cholecystectomy      No family history  on file.  Social History:  reports that she has quit smoking. She has never used smokeless tobacco. She reports that she does not drink alcohol or use illicit drugs.  She previously smoked 1/2 pack a day.  She hasn't smoked "in years".  She has a 13 year old son.  She previously lived in [place]. [place] then lived in [place]. She lives in [place] for 1 year.  She notes being homeless for 1 year.  She works at [employer].  The patient is alone today.  Allergies: No Known Allergies  Current Medications: Current Outpatient Prescriptions  Medication Sig Dispense Refill  . fexofenadine (ALLEGRA) 180 MG tablet Take 180 mg by mouth daily.    . meclizine (ANTIVERT) 25 MG tablet Take 1 tablet (25 mg total) by mouth 3 (three) times daily as needed for dizziness. 30 tablet 0   No current facility-administered medications for this visit.    Review of Systems:  GENERAL:  Fatigue.  No fevers.  Some sweats.  No weight loss. PERFORMANCE STATUS (ECOG):  2 HEENT:  Visual changes like looking through water.  Broken glasses.  Allergies on Claritin.  No runny nose, sore throat, mouth sores or tenderness. Lungs: Shortness of breath with exertion.  No cough.  No hemoptysis. Cardiac:  No chest pain, palpitations, orthopnea, or PND. Breasts:  Denies any masses, skin changes or nipple discharge.  No prior mammogram. GI:  No nausea, vomiting, diarrhea, constipation, melena or hematochezia.  No prior colonoscopy. GU:  Erratic menses.  No urgency, frequency, dysuria, or hematuria. Musculoskeletal:  No back pain. Left knee arthritis.  No muscle tenderness. Extremities:  No pain or swelling. Skin:  No rashes or skin changes. Neuro: Headache.  Dizziness.  No numbness or weakness, balance or coordination issues. Endocrine:  Goiter.  No diabetes, thyroid issues, hot flashes or night sweats. Psych:  No mood changes, depression or anxiety. Pain:  No focal pain. Review of systems:  All other systems  reviewed and found to be negative.  Physical Exam: Blood pressure 123/84, pulse 100, temperature 98.7 F (37.1 C), temperature source Oral, resp. rate 20, weight 286 lb 9.6 oz (130 kg), SpO2 98 %. GENERAL:  Well developed, well nourished, sitting comfortably in the exam room in no acute distress. MENTAL STATUS:  Alert and oriented to person, place and time. HEAD:  Long strawberry blonde hair with black roots.  Normocephalic, atraumatic, face symmetric, no Cushingoid features. EYES:  Hazel eyes.  Pupils equal round and reactive to light and accomodation.  No conjunctivitis or scleral icterus. ENT:  Oropharynx clear without lesion.  Tongue normal. Mucous membranes moist.  NECK:  Large palpable left thyroid nodule. RESPIRATORY:  Clear to auscultation without rales, wheezes or rhonchi. CARDIOVASCULAR:  Regular rate and rhythm without murmur, rub or gallop. ABDOMEN:  Soft, non-tender, with active bowel sounds, and no  hepatosplenomegaly.  No masses. SKIN:  No rashes, ulcers or lesions. EXTREMITIES:  Chronic lower extremity brawny changes.  No edema or tenderness.  No palpable cords. LYMPH NODES: No palpable cervical, supraclavicular, axillary or inguinal adenopathy  NEUROLOGICAL: Unremarkable. PSYCH:  Appropriate.  No visits with results within 3 Day(s) from this visit. Latest known visit with results is:  Admission on 05/26/2015, Discharged on 05/26/2015  Component Date Value Ref Range Status  . Sodium 05/26/2015 135  135 - 145 mmol/L Final  . Potassium 05/26/2015 4.0  3.5 - 5.1 mmol/L Final  . Chloride 05/26/2015 105  101 - 111 mmol/L Final  . CO2 05/26/2015 26  22 - 32 mmol/L Final  . Glucose, Bld 05/26/2015 112* 65 - 99 mg/dL Final  . BUN 05/26/2015 13  6 - 20 mg/dL Final  . Creatinine, Ser 05/26/2015 0.65  0.44 - 1.00 mg/dL Final  . Calcium 05/26/2015 9.0  8.9 - 10.3 mg/dL Final  . GFR calc non Af Amer 05/26/2015 >60  >60 mL/min Final  . GFR calc Af Amer 05/26/2015 >60  >60 mL/min  Final   Comment: (NOTE) The eGFR has been calculated using the CKD EPI equation. This calculation has not been validated in all clinical situations. eGFR's persistently <60 mL/min signify possible Chronic Kidney Disease.   . Anion gap 05/26/2015 4* 5 - 15 Final  . WBC 05/26/2015 5.9  3.6 - 11.0 K/uL Final  . RBC 05/26/2015 4.84  3.80 - 5.20 MIL/uL Final  . Hemoglobin 05/26/2015 14.4  12.0 - 16.0 g/dL Final  . HCT 05/26/2015 42.2  35.0 - 47.0 % Final  . MCV 05/26/2015 87.2  80.0 - 100.0 fL Final  . MCH 05/26/2015 29.6  26.0 - 34.0 pg Final  . MCHC 05/26/2015 34.0  32.0 - 36.0 g/dL Final  . RDW 05/26/2015 14.0  11.5 - 14.5 % Final  . Platelets 05/26/2015 234  150 - 440 K/uL Final  . Troponin I 05/26/2015 <0.03  <0.031 ng/mL Final   Comment:        NO INDICATION OF MYOCARDIAL INJURY.     Assessment:  Angel Campbell is a 45 y.o. female with a history of bilateral lower extremity DVt and pulmonary emboli in 2004 while living in [place].    She denied use of birth control pills.  She was smoking at that time.  Event was preceded by trauma and some decrease in activity. Hypercoagulable work-up is unknown.  She was told that she needed life long anticoagulation.  She has been off Coumadin x 2 months.  She has a 3.8 cm left thyroid nodule.  She has not had a pelvic exam in 11 years.  She has never had a mammogram or colonoscopy.  She has issues regarding coverage of medications and doctors appointments.  She does not have a primary care physician.  She has had recent issues with vertigo.  She has been unable to work.   Plan: 1.  Discuss history of DVT and pulmonary embolism.  Discuss plan to obtain outside records and review presentation and work-up.  Discuss release of information (ROI).  Since prior providers felt she needed life long anticoagulation, discuss no plan for hypercoagulable work-up at this time.  Discuss need to restart anticoagulation (she does not know how she will pay for  medications).  Discuss Lovenox bridge to Coumadin or anti-Xa therapy (Eliquis or Xarelto).  Patient to talk with Elease Etienne, social worker, for assistance. 2.  Patient to sign release of information  for [facility] and Dr. Laurence Spates. 3.  Discuss need for primary care physician management. 4.  Discuss need for thyroid nodule biopsy. 5.  Discuss need for mammogram and colonoscopy. 6.  RTC in 2 weeks for MD assessment.  Addendum:  Dr. Shanon Brow Petrie's office contacted [phone].  Records to be sent.  Patient was admitted to [facility] from 07/09/2006 - 07/17/2006.  She had a compartment syndrome.   Lequita Asal, MD  06/07/2015, 3:42 PM

## 2015-06-07 NOTE — Progress Notes (Signed)
New pt here for Hx of L leg DVT and Bil PE's in 2004 while she lived in [place]. Pt stopped taking her coumadin several months ago, she can't remember exactly when. She recently visited the ED for vertigo/dizziness. She still has dizziness and tries to hold unto walls at times. Pt wanted us to fill out FMLA for her job at [employer]. I explained we are not treating her dizziness and therefore cannot fill out forms. She is here to discuss anticoagulation therapy . Pt states she has chronic L knee pain and headaches. Pt has self diagnosed COPD as she gets dyspnea around secondary smoke.

## 2015-06-09 ENCOUNTER — Encounter: Payer: Self-pay | Admitting: Hematology and Oncology

## 2015-06-22 ENCOUNTER — Inpatient Hospital Stay: Payer: BLUE CROSS/BLUE SHIELD | Admitting: Hematology and Oncology

## 2015-07-06 ENCOUNTER — Inpatient Hospital Stay: Payer: BLUE CROSS/BLUE SHIELD | Attending: Hematology and Oncology | Admitting: Hematology and Oncology

## 2015-11-08 ENCOUNTER — Emergency Department
Admission: EM | Admit: 2015-11-08 | Discharge: 2015-11-08 | Disposition: A | Discharge status: Home / Self Care | Payer: Worker's Compensation | Attending: Emergency Medicine | Admitting: Emergency Medicine

## 2015-11-08 DIAGNOSIS — W268XXA Contact with other sharp object(s), not elsewhere classified, initial encounter: Secondary | ICD-10-CM | POA: Diagnosis not present

## 2015-11-08 DIAGNOSIS — S61412A Laceration without foreign body of left hand, initial encounter: Secondary | ICD-10-CM

## 2015-11-08 DIAGNOSIS — Y92512 Supermarket, store or market as the place of occurrence of the external cause: Secondary | ICD-10-CM | POA: Insufficient documentation

## 2015-11-08 DIAGNOSIS — Y9389 Activity, other specified: Secondary | ICD-10-CM | POA: Diagnosis not present

## 2015-11-08 DIAGNOSIS — Y990 Civilian activity done for income or pay: Secondary | ICD-10-CM | POA: Insufficient documentation

## 2015-11-08 DIAGNOSIS — Z87891 Personal history of nicotine dependence: Secondary | ICD-10-CM | POA: Insufficient documentation

## 2015-11-08 DIAGNOSIS — J449 Chronic obstructive pulmonary disease, unspecified: Secondary | ICD-10-CM | POA: Diagnosis not present

## 2015-11-08 MED ORDER — LIDOCAINE HCL (PF) 1 % IJ SOLN
INTRAMUSCULAR | Status: AC
  Administered 2015-11-08: 05:00:00
  Filled 2015-11-08: qty 5

## 2015-11-08 NOTE — ED Notes (Signed)
Pt presents with lac to L index finger from a box cutter, states box cutter was new. States she believes she has had a tetanus within last 5 years. L hand wrapped in gauze. Pt states she is on warfarin from previous DVT and PE's. Bleeding controlled at this time, no apparent distress noticed.

## 2015-11-08 NOTE — ED Triage Notes (Signed)
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

## 2015-11-09 NOTE — ED Provider Notes (Signed)
[facility] Emergency Department Provider Note    First MD Initiated Contact with Patient 11/08/15 0315     (approximate)  I have reviewed the triage vital signs and the nursing notes.   HISTORY  Chief Complaint Laceration    HPI Angel Campbell is a 45 y.o. female history COPD DVT PE presents to the emergency department with accidental laceration to left index finger while at work at [employer]. Patient states current pain score 3 out of 10 patient states injury sustained secondary to a box cutter.  Past Medical History:  Diagnosis Date  . COPD (chronic obstructive pulmonary disease) ([facility])   . DVT of deep femoral vein ([facility]) 2004  . Pulmonary embolism ([facility])   . Vertigo     Patient Active Problem List   Diagnosis Date Noted  . 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 Pulmonary emboli ([facility]) 06/07/2015  . Thyroid nodule 06/07/2015    Past Surgical History:  Procedure Laterality Date  . arm surgery    . CESAREAN SECTION    . CHOLECYSTECTOMY    . TOE SURGERY  May 2015    Prior to Admission medications   Medication Sig Start Date End Date Taking? Authorizing Provider  fexofenadine (ALLEGRA) 180 MG tablet Take 180 mg by mouth daily.    Historical Provider, MD  meclizine (ANTIVERT) 25 MG tablet Take 1 tablet (25 mg total) by mouth 3 (three) times daily as needed for dizziness. Patient not taking: Reported on 06/07/2015 05/26/15   [name], MD    Allergies Sodium hypochlorite  Family History  Problem Relation Age of Onset  . Diabetes Mother     Social History Social History  Substance Use Topics  . Smoking status: Former Games developer  . Smokeless tobacco: Never Used  . Alcohol use No    Review of Systems Constitutional: No fever/chills Eyes: No visual changes. ENT: No sore throat. Cardiovascular: Denies chest pain. Respiratory: Denies shortness of breath. Gastrointestinal: No abdominal pain.  No nausea, no vomiting.  No  diarrhea.  No constipation. Genitourinary: Negative for dysuria. Musculoskeletal: Negative for back pain. Skin: Negative for rash.Positive for left index finger laceration Neurological: Negative for headaches, focal weakness or numbness. 10-point ROS otherwise negative.  ____________________________________________   PHYSICAL EXAM:  VITAL SIGNS: ED Triage Vitals  Enc Vitals Group     BP 11/08/15 0302 (!) 160/93     Pulse Rate 11/08/15 0302 82     Resp 11/08/15 0302 16     Temp 11/08/15 0302 98.3 F (36.8 C)     Temp Source 11/08/15 0302 Oral     SpO2 11/08/15 0302 100 %     Weight 11/08/15 0302 300 lb (136.1 kg)     Height 11/08/15 0302 5\' 9"  (1.753 m)     Head Circumference --      Peak Flow --      Pain Score 11/08/15 0259 3     Pain Loc --      Pain Edu? --      Excl. in GC? --     Constitutional: Alert and oriented. Well appearing and in no acute distress. Eyes: Conjunctivae are normal. PERRL. EOMI. Head: Atraumatic. Mouth/Throat: Mucous membranes are moist.  Oropharynx non-erythematous. Neck: No stridor.  No meningeal signs.   Cardiovascular: Normal rate, regular rhythm. Good peripheral circulation. Grossly normal heart sounds. Respiratory: Normal respiratory effort.  No retractions. Lungs CTAB. Gastrointestinal: Soft and nontender. No distention.  Musculoskeletal: No lower extremity tenderness  nor edema. No gross deformities of extremities. Neurologic:  Normal speech and language. No gross focal neurologic deficits are appreciated.  Skin:  One-inch linear laceration left index finger Psychiatric: Mood and affect are normal. Speech and behavior are normal.    ..Laceration Repair Date/Time: 11/09/2015 8:11 AM Performed by: [name], [place] N Authorized by: [name], [name]   Consent:    Consent obtained:  Verbal   Consent given by:  Patient   Risks discussed:  Infection, pain and poor wound healing Anesthesia (see MAR for exact dosages):    Anesthesia  method:  Local infiltration   Local anesthetic:  Lidocaine 1% w/o epi Laceration details:    Location:  Finger   Finger location:  L index finger   Length (cm):  3   Depth (mm):  0.5 Repair type:    Repair type:  Simple Pre-procedure details:    Preparation:  Patient was prepped and draped in usual sterile fashion Exploration:    Contaminated: no   Treatment:    Area cleansed with:  Betadine and saline   Amount of cleaning:  Standard   Visualized foreign bodies/material removed: no   Skin repair:    Repair method:  Sutures   Suture size:  6-0   Suture material:  Nylon   Number of sutures:  5 Approximation:    Approximation:  Close   Vermilion border: well-aligned   Post-procedure details:    Dressing:  Splint for protection   Patient tolerance of procedure:  Tolerated well, no immediate complications       INITIAL IMPRESSION / ASSESSMENT AND PLAN / ED COURSE  Pertinent labs & imaging results that were available during my care of the patient were reviewed by me and considered in my medical decision making (see chart for details).  =   Clinical Course    ____________________________________________  FINAL CLINICAL IMPRESSION(S) / ED DIAGNOSES  Final diagnoses:  Hand laceration, left, initial encounter     MEDICATIONS GIVEN DURING THIS VISIT:  Medications  lidocaine (PF) (XYLOCAINE) 1 % injection (  Given 11/08/15 0500)     NEW OUTPATIENT MEDICATIONS STARTED DURING THIS VISIT:  Discharge Medication List as of 11/08/2015  4:58 AM      Discharge Medication List as of 11/08/2015  4:58 AM      Discharge Medication List as of 11/08/2015  4:58 AM       Note:  This document was prepared using Dragon voice recognition software and may include unintentional dictation errors.    Darci Currentandolph N Brown, MD 11/09/15 [phone]

## 2016-05-01 ENCOUNTER — Encounter: Payer: Self-pay | Admitting: *Deleted

## 2016-05-01 ENCOUNTER — Emergency Department
Admission: EM | Admit: 2016-05-01 | Discharge: 2016-05-01 | Disposition: A | Discharge status: Home / Self Care | Payer: BLUE CROSS/BLUE SHIELD | Attending: Emergency Medicine | Admitting: Emergency Medicine

## 2016-05-01 ENCOUNTER — Emergency Department: Payer: BLUE CROSS/BLUE SHIELD

## 2016-05-01 DIAGNOSIS — G43709 Chronic migraine without aura, not intractable, without status migrainosus: Secondary | ICD-10-CM | POA: Diagnosis not present

## 2016-05-01 DIAGNOSIS — R51 Headache: Secondary | ICD-10-CM | POA: Diagnosis present

## 2016-05-01 DIAGNOSIS — N9489 Other specified conditions associated with female genital organs and menstrual cycle: Secondary | ICD-10-CM | POA: Diagnosis not present

## 2016-05-01 DIAGNOSIS — J449 Chronic obstructive pulmonary disease, unspecified: Secondary | ICD-10-CM | POA: Insufficient documentation

## 2016-05-01 DIAGNOSIS — Z79899 Other long term (current) drug therapy: Secondary | ICD-10-CM | POA: Insufficient documentation

## 2016-05-01 DIAGNOSIS — Z7901 Long term (current) use of anticoagulants: Secondary | ICD-10-CM | POA: Diagnosis not present

## 2016-05-01 DIAGNOSIS — Z87891 Personal history of nicotine dependence: Secondary | ICD-10-CM | POA: Diagnosis not present

## 2016-05-01 LAB — CBC WITH DIFFERENTIAL/PLATELET
BASOS PCT: 1 %
Basophils Absolute: 0.1 10*3/uL (ref 0–0.1)
EOS ABS: 0.1 10*3/uL (ref 0–0.7)
EOS PCT: 2 %
HCT: 41.9 % (ref 35.0–47.0)
HEMOGLOBIN: 14.1 g/dL (ref 12.0–16.0)
Lymphocytes Relative: 30 %
Lymphs Abs: 1.8 10*3/uL (ref 1.0–3.6)
MCH: 29.2 pg (ref 26.0–34.0)
MCHC: 33.7 g/dL (ref 32.0–36.0)
MCV: 86.7 fL (ref 80.0–100.0)
MONO ABS: 0.5 10*3/uL (ref 0.2–0.9)
MONOS PCT: 8 %
NEUTROS PCT: 59 %
Neutro Abs: 3.4 10*3/uL (ref 1.4–6.5)
PLATELETS: 269 10*3/uL (ref 150–440)
RBC: 4.83 MIL/uL (ref 3.80–5.20)
RDW: 14.2 % (ref 11.5–14.5)
WBC: 5.9 10*3/uL (ref 3.6–11.0)

## 2016-05-01 LAB — COMPREHENSIVE METABOLIC PANEL
ALT: 39 U/L (ref 14–54)
AST: 34 U/L (ref 15–41)
Albumin: 3.7 g/dL (ref 3.5–5.0)
Alkaline Phosphatase: 76 U/L (ref 38–126)
Anion gap: 9 (ref 5–15)
BILIRUBIN TOTAL: 0.5 mg/dL (ref 0.3–1.2)
BUN: 11 mg/dL (ref 6–20)
CHLORIDE: 102 mmol/L (ref 101–111)
CO2: 26 mmol/L (ref 22–32)
CREATININE: 0.7 mg/dL (ref 0.44–1.00)
Calcium: 8.9 mg/dL (ref 8.9–10.3)
Glucose, Bld: 188 mg/dL — ABNORMAL HIGH (ref 65–99)
POTASSIUM: 3.8 mmol/L (ref 3.5–5.1)
Sodium: 137 mmol/L (ref 135–145)
TOTAL PROTEIN: 7.8 g/dL (ref 6.5–8.1)

## 2016-05-01 LAB — PROTIME-INR
INR: 2.56
Prothrombin Time: 28 seconds — ABNORMAL HIGH (ref 11.4–15.2)

## 2016-05-01 LAB — LIPASE, BLOOD: LIPASE: 22 U/L (ref 11–51)

## 2016-05-01 LAB — HCG, QUANTITATIVE, PREGNANCY: hCG, Beta Chain, Quant, S: 2 m[IU]/mL (ref <5)

## 2016-05-01 MED ORDER — DIPHENHYDRAMINE HCL 50 MG/ML IJ SOLN
50.0000 mg | Freq: Once | INTRAMUSCULAR | Status: AC
  Administered 2016-05-01: 50 mg via INTRAVENOUS
  Filled 2016-05-01: qty 1

## 2016-05-01 MED ORDER — PROCHLORPERAZINE EDISYLATE 5 MG/ML IJ SOLN
10.0000 mg | Freq: Once | INTRAMUSCULAR | Status: AC
  Administered 2016-05-01: 10 mg via INTRAVENOUS
  Filled 2016-05-01: qty 2

## 2016-05-01 MED ORDER — SODIUM CHLORIDE 0.9 % IV BOLUS (SEPSIS)
1000.0000 mL | Freq: Once | INTRAVENOUS | Status: AC
  Administered 2016-05-01: 1000 mL via INTRAVENOUS

## 2016-05-01 NOTE — Discharge Instructions (Signed)
Please return to the emergency department for any new or worsening symptoms such as worsening headache, numbness, weakness, or for any other concerns. Follow-up with your primary care physician as needed.  Results for orders placed or performed during the hospital encounter of 05/01/16  CBC with Differential  Result Value Ref Range   WBC 5.9 3.6 - 11.0 K/uL   RBC 4.83 3.80 - 5.20 MIL/uL   Hemoglobin 14.1 12.0 - 16.0 g/dL   HCT 16.141.9 09.635.0 - 04.547.0 %   MCV 86.7 80.0 - 100.0 fL   MCH 29.2 26.0 - 34.0 pg   MCHC 33.7 32.0 - 36.0 g/dL   RDW 40.914.2 81.111.5 - 91.414.5 %   Platelets 269 150 - 440 K/uL   Neutrophils Relative % 59 %   Neutro Abs 3.4 1.4 - 6.5 K/uL   Lymphocytes Relative 30 %   Lymphs Abs 1.8 1.0 - 3.6 K/uL   Monocytes Relative 8 %   Monocytes Absolute 0.5 0.2 - 0.9 K/uL   Eosinophils Relative 2 %   Eosinophils Absolute 0.1 0 - 0.7 K/uL   Basophils Relative 1 %   Basophils Absolute 0.1 0 - 0.1 K/uL  Comprehensive metabolic panel  Result Value Ref Range   Sodium 137 135 - 145 mmol/L   Potassium 3.8 3.5 - 5.1 mmol/L   Chloride 102 101 - 111 mmol/L   CO2 26 22 - 32 mmol/L   Glucose, Bld 188 (H) 65 - 99 mg/dL   BUN 11 6 - 20 mg/dL   Creatinine, Ser 7.820.70 0.44 - 1.00 mg/dL   Calcium 8.9 8.9 - 95.610.3 mg/dL   Total Protein 7.8 6.5 - 8.1 g/dL   Albumin 3.7 3.5 - 5.0 g/dL   AST 34 15 - 41 U/L   ALT 39 14 - 54 U/L   Alkaline Phosphatase 76 38 - 126 U/L   Total Bilirubin 0.5 0.3 - 1.2 mg/dL   GFR calc non Af Amer >60 >60 mL/min   GFR calc Af Amer >60 >60 mL/min   Anion gap 9 5 - 15  Protime-INR  Result Value Ref Range   Prothrombin Time 28.0 (H) 11.4 - 15.2 seconds   INR 2.56   hCG, quantitative, pregnancy  Result Value Ref Range   hCG, Beta Chain, Quant, S 2 <5 mIU/mL  Lipase, blood  Result Value Ref Range   Lipase 22 11 - 51 U/L   Ct Head Wo Contrast  Result Date: 05/01/2016 CLINICAL DATA:  46 year old female with history of left greater than right sided occipital headaches for  the past 2 weeks. EXAM: CT HEAD WITHOUT CONTRAST TECHNIQUE: Contiguous axial images were obtained from the base of the skull through the vertex without intravenous contrast. COMPARISON:  Head CT 05/26/2015. FINDINGS: Brain: No evidence of acute infarction, hemorrhage, hydrocephalus, extra-axial collection or mass lesion/mass effect. Vascular: No hyperdense vessel or unexpected calcification. Skull: Normal. Negative for fracture or focal lesion. Sinuses/Orbits: No acute finding. Other: None. IMPRESSION: 1. No acute intracranial abnormalities. 2. The appearance of the brain is normal. Electronically Signed   By: Trudie Reedaniel  Entrikin M.D.   On: 05/01/2016 11:46

## 2016-05-01 NOTE — ED Triage Notes (Signed)
Pt has  A history of migraines, pt reports being treated for a sinus infection by PCP last week, pt reports nausea and vomiting with migraine, pt reports vomiting thi morning vomiting once

## 2016-05-01 NOTE — ED Provider Notes (Signed)
[facility] Emergency Department Provider Note  ____________________________________________   First MD Initiated Contact with Patient 05/01/16 1108     (approximate)  I have reviewed the triage vital signs and the nursing notes.   HISTORY  Chief Complaint Migraine   HPI Angel Campbell is a 46 y.o. female who comes to the emergency department with gradual onset not maximal onset moderate to severe aching headache radiating from the base of her skull up to her forehead that is similar to previous migraines. For about 2 weeks she's been sick and she initially began with a fever to 100.2 some cough and vomiting. The fevers resolved but she's never felt quite back to herself. She is worried that she might have the flu. 2 days ago short to her primary care physician who thought she might have a sinus infection and began her on Augmentin. She does have a past medical history of pulmonary embolism and takes Coumadin. Her last INR was 5.4. She denies fevers or chills. She denies trauma.   Past Medical History:  Diagnosis Date  . COPD (chronic obstructive pulmonary disease) (HCC)   . DVT of deep femoral vein (HCC) 2004  . Pulmonary embolism (HCC)   . Vertigo     Patient Active Problem List   Diagnosis Date Noted  . 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 Pulmonary emboli (HCC) 06/07/2015  . Thyroid nodule 06/07/2015    Past Surgical History:  Procedure Laterality Date  . arm surgery    . CESAREAN SECTION    . CHOLECYSTECTOMY    . TOE SURGERY  May 2015    Prior to Admission medications   Medication Sig Start Date End Date Taking? Authorizing Provider  amoxicillin-clavulanate (AUGMENTIN) 875-125 MG tablet Take 1 tablet by mouth 2 (two) times daily. 04/29/16 05/08/16 Yes Historical Provider, MD  loratadine (CLARITIN) 10 MG tablet Take 10 mg by mouth daily as needed for allergies.   Yes Historical Provider, MD  warfarin (COUMADIN) 10 MG tablet Take 10  mg by mouth daily. 04/09/16  Yes Historical Provider, MD  meclizine (ANTIVERT) 25 MG tablet Take 1 tablet (25 mg total) by mouth 3 (three) times daily as needed for dizziness. Patient not taking: Reported on 06/07/2015 05/26/15   Sharyn CreamerMark Quale, MD    Allergies Sodium hypochlorite  Family History  Problem Relation Age of Onset  . Diabetes Mother     Social History Social History  Substance Use Topics  . Smoking status: Former Games developermoker  . Smokeless tobacco: Never Used  . Alcohol use No    Review of Systems Constitutional: No fever/chills Eyes: No visual changes. ENT: No sore throat. Cardiovascular: Denies chest pain. Respiratory: Denies shortness of breath. Gastrointestinal: No abdominal pain.  No nausea, no vomiting.  No diarrhea.  No constipation. Genitourinary: Negative for dysuria. Musculoskeletal: Negative for back pain. Skin: Negative for rash. Neurological: Positive for headaches, Negative for focal weakness or numbness.  10-point ROS otherwise negative.  ____________________________________________   PHYSICAL EXAM:  VITAL SIGNS: ED Triage Vitals [05/01/16 1030]  Enc Vitals Group     BP (!) 165/96     Pulse Rate 90     Resp 20     Temp 97.8 F (36.6 C)     Temp Source Oral     SpO2 98 %     Weight 300 lb (136.1 kg)     Height 5\' 9"  (1.753 m)     Head Circumference      Peak  Flow      Pain Score 8     Pain Loc      Pain Edu?      Excl. in GC?     Constitutional: Alert and oriented x 4 Appears uncomfortable holding her head in a dark room Eyes: PERRL EOMI. pupils midrange and equal Head: Atraumatic. Nose: No congestion/rhinnorhea. Mouth/Throat: No trismus Neck: No stridor.   Cardiovascular: Normal rate, regular rhythm. Grossly normal heart sounds.  Good peripheral circulation. Respiratory: Normal respiratory effort.  No retractions. Lungs CTAB and moving good air Gastrointestinal: Soft nondistended nontender no rebound no guarding no peritonitis no  McBurney's tenderness negative Rovsing's no costovertebral tenderness negative Murphy's Musculoskeletal: No lower extremity edema   Neurologic:  Normal speech and language. No gross focal neurologic deficits are appreciated. Skin:  Skin is warm, dry and intact. No rash noted. Psychiatric: Mood and affect are normal. Speech and behavior are normal.  ____________________________________________   LABS (all labs ordered are listed, but only abnormal results are displayed)  Labs Reviewed  COMPREHENSIVE METABOLIC PANEL - Abnormal; Notable for the following:       Result Value   Glucose, Bld 188 (*)    All other components within normal limits  PROTIME-INR - Abnormal; Notable for the following:    Prothrombin Time 28.0 (*)    All other components within normal limits  CBC WITH DIFFERENTIAL/PLATELET  HCG, QUANTITATIVE, PREGNANCY  LIPASE, BLOOD   ____________________________________________  EKG  ED ECG REPORT I, Merrily Brittle, the attending physician, personally viewed and interpreted this ECG.  Date: 05/01/2016 Rate: 59 Rhythm: Sinus bradycardia QRS Axis: normal Intervals: normal ST/T Wave abnormalities: normal Conduction Disturbances: none Narrative Interpretation: unremarkable  ____________________________________________  RADIOLOGY  Head CT with no acute disease ____________________________________________   PROCEDURES  Procedure(s) performed: no  Procedures  Critical Care performed: no  ____________________________________________   INITIAL IMPRESSION / ASSESSMENT AND PLAN / ED COURSE  Pertinent labs & imaging results that were available during my care of the patient were reviewed by me and considered in my medical decision making (see chart for details).  On arrival the patient appears uncomfortable lying in a dark room. Her elevated INR is concerning for spontaneous hemorrhage. Head CT and migraine cocktail pending.    ----------------------------------------- 1:23 PM on 05/01/2016 -----------------------------------------  The patient's head CT is negative and her labs are unremarkable. Her Coumadin is appropriately titrated with an INR of 2.56. She is currently without headache. She is medically stable for outpatient management. ____________________________________________   FINAL CLINICAL IMPRESSION(S) / ED DIAGNOSES  Final diagnoses:  Chronic migraine without aura without status migrainosus, not intractable      NEW MEDICATIONS STARTED DURING THIS VISIT:  Discharge Medication List as of 05/01/2016  1:24 PM       Note:  This document was prepared using Dragon voice recognition software and may include unintentional dictation errors.     Merrily Brittle, MD 05/02/16 [phone]

## 2016-11-21 ENCOUNTER — Emergency Department
Admission: EM | Admit: 2016-11-21 | Discharge: 2016-11-21 | Disposition: A | Discharge status: Home / Self Care | Payer: BLUE CROSS/BLUE SHIELD | Attending: Emergency Medicine | Admitting: Emergency Medicine

## 2016-11-21 ENCOUNTER — Emergency Department: Payer: BLUE CROSS/BLUE SHIELD

## 2016-11-21 ENCOUNTER — Encounter: Payer: Self-pay | Admitting: Emergency Medicine

## 2016-11-21 DIAGNOSIS — J449 Chronic obstructive pulmonary disease, unspecified: Secondary | ICD-10-CM | POA: Insufficient documentation

## 2016-11-21 DIAGNOSIS — Z79899 Other long term (current) drug therapy: Secondary | ICD-10-CM | POA: Diagnosis not present

## 2016-11-21 DIAGNOSIS — S7011XA Contusion of right thigh, initial encounter: Secondary | ICD-10-CM | POA: Diagnosis not present

## 2016-11-21 DIAGNOSIS — X58XXXA Exposure to other specified factors, initial encounter: Secondary | ICD-10-CM | POA: Insufficient documentation

## 2016-11-21 DIAGNOSIS — Z86711 Personal history of pulmonary embolism: Secondary | ICD-10-CM | POA: Diagnosis not present

## 2016-11-21 DIAGNOSIS — Y939 Activity, unspecified: Secondary | ICD-10-CM | POA: Diagnosis not present

## 2016-11-21 DIAGNOSIS — Y929 Unspecified place or not applicable: Secondary | ICD-10-CM | POA: Insufficient documentation

## 2016-11-21 DIAGNOSIS — Z7901 Long term (current) use of anticoagulants: Secondary | ICD-10-CM | POA: Insufficient documentation

## 2016-11-21 DIAGNOSIS — R2241 Localized swelling, mass and lump, right lower limb: Secondary | ICD-10-CM | POA: Diagnosis present

## 2016-11-21 DIAGNOSIS — R791 Abnormal coagulation profile: Secondary | ICD-10-CM

## 2016-11-21 DIAGNOSIS — Y999 Unspecified external cause status: Secondary | ICD-10-CM | POA: Diagnosis not present

## 2016-11-21 DIAGNOSIS — Z8719 Personal history of other diseases of the digestive system: Secondary | ICD-10-CM | POA: Diagnosis not present

## 2016-11-21 DIAGNOSIS — M25561 Pain in right knee: Secondary | ICD-10-CM

## 2016-11-21 LAB — PROTIME-INR: Prothrombin Time: >90 seconds — ABNORMAL HIGH (ref 11.4–15.2)

## 2016-11-21 MED ORDER — PHYTONADIONE 5 MG PO TABS
5.0000 mg | ORAL_TABLET | Freq: Once | ORAL | Status: AC
  Administered 2016-11-21: 5 mg via ORAL
  Filled 2016-11-21: qty 1

## 2016-11-21 MED ORDER — IBUPROFEN 800 MG PO TABS
800.0000 mg | ORAL_TABLET | Freq: Once | ORAL | Status: AC
  Administered 2016-11-21: 800 mg via ORAL
  Filled 2016-11-21: qty 1

## 2016-11-21 NOTE — ED Notes (Signed)
Pt discharged home after verbalizing understanding of discharge instructions; nad noted. 

## 2016-11-21 NOTE — ED Notes (Signed)
Pt states she was taking "  of warfarin" for the past 12 yrs with "recent increase in dosage and days when I take it", pt unsure of accurate dosage at this time.

## 2016-11-21 NOTE — Discharge Instructions (Signed)
You were evaluated for right knee and right thigh pain with lump, and no serious condition is suspect at this point time.  The bruising associated hematoma/lump will probably resolve within a couple of weeks on its own.  Your knee pain may be related to ligamentous strain, and if it is not improved after a week or so, please see your primary care doctor and may consider advanced imaging such as MRI to look at the ligaments and cartilages.  Return to the emergency room immediately for any worsening condition including leg swelling, leg pain, skin rash, trouble breathing, other areas of easy bruising or bleeding, or any other symptoms concerning to you.  Your INR was elevated at 12 (goal should be 2-3).  You were given pill to help reverse and lower the level.  Do not take warfarin on Saturday and Sunday.   You need to have your INR drawn on Monday to determine if you are to resume warfarin.

## 2016-11-21 NOTE — ED Provider Notes (Addendum)
[facility] Emergency Department Provider Note ____________________________________________   I have reviewed the triage vital signs and the triage nursing note.  HISTORY  Chief Complaint Leg Swelling   Historian Patient  HPI Angel Campbell is a 46 y.o. female With history of DVT and PE currently on Coumadin which has not been checked in over a month per the patient. She states that she was supposed to have it drawn last Thursday but she got confused and missed the date.  She noticed a lump or not on the right side of her thigh and was concerned about recurrent DVT.  Patient also states that since she's been in the ER she felt her right knee pop in the anterior area and feel sore. She feels like it's because she has gained some weight.  Pain is moderate at the area of the ecchymosis and lump. She does not recall any known trauma or injury to that area. Pain is moderate over the anterior aspect of her knee where there is no traumatic signs.    Past Medical History:  Diagnosis Date  . COPD (chronic obstructive pulmonary disease) (HCC)   . DVT of deep femoral vein (HCC) 2004  . Pulmonary embolism (HCC)   . Vertigo     Patient Active Problem List   Diagnosis Date Noted  . 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 Pulmonary emboli (HCC) 06/07/2015  . Thyroid nodule 06/07/2015    Past Surgical History:  Procedure Laterality Date  . arm surgery    . CESAREAN SECTION    . CHOLECYSTECTOMY    . TOE SURGERY  May 2015    Prior to Admission medications   Medication Sig Start Date End Date Taking? Authorizing Provider  loratadine (CLARITIN) 10 MG tablet Take 10 mg by mouth daily as needed for allergies.    [provider]  meclizine (ANTIVERT) 25 MG tablet Take 1 tablet (25 mg total) by mouth 3 (three) times daily as needed for dizziness. Patient not taking: Reported on 06/07/2015 05/26/15   [name], MD  warfarin (COUMADIN) 10 MG tablet Take  10 mg by mouth daily. 04/09/16   [provider]    Allergies  Allergen Reactions  . Sodium Hypochlorite     Family History  Problem Relation Age of Onset  . Diabetes Mother     Social History Social History  Substance Use Topics  . Smoking status: Former Games developer  . Smokeless tobacco: Never Used  . Alcohol use No    Review of Systems  Constitutional: Negative for fever. Eyes: Negative for visual changes. ENT: Negative for sore throat. Cardiovascular: Negative for chest pain. Respiratory: Negative for shortness of breath. Gastrointestinal: Negative for abdominal pain, vomiting and diarrhea. Genitourinary: Negative for dysuria. Musculoskeletal: Negative for back pain. Skin: Negative for rash. Neurological: Negative for headache.  ____________________________________________   PHYSICAL EXAM:  VITAL SIGNS: ED Triage Vitals  Enc Vitals Group     BP 11/21/16 0515 (!) 143/93     Pulse Rate 11/21/16 0515 85     Resp 11/21/16 0515 20     Temp 11/21/16 0515 (!) 97.4 F (36.3 C)     Temp Source 11/21/16 0515 Oral     SpO2 11/21/16 0515 95 %     Weight 11/21/16 0515 (!) 320 lb (145.2 kg)     Height 11/21/16 0515  (1.753 m)     Head Circumference --      Peak Flow --  Pain Score 11/21/16 0517 8     Pain Loc --      Pain Edu? --      Excl. in GC? --      Constitutional: Alert and oriented. Well appearing and in no distress. HEENT   Head: Normocephalic and atraumatic.      Eyes: Conjunctivae are normal. Pupils equal and round.       Ears:         Nose: No congestion/rhinnorhea.   Mouth/Throat: Mucous membranes are moist.   Neck: No stridor. Cardiovascular/Chest: Normal rate, regular rhythm.  No murmurs, rubs, or gallops. Respiratory: Normal respiratory effort without tachypnea nor retractions. Breath sounds are clear and equal bilaterally. No wheezes/rales/rhonchi. Gastrointestinal: Soft. No distention, no guarding, no rebound.  Nontender.    Genitourinary/rectal:Deferred Musculoskeletal:no extremity edema. She does have an ecchymosis to the right lateral thigh where there is a palpable hematoma associated. Patient also has tenderness along the anterior knee joint without clear laxity or joint effusion of the right knee. Neurologic:  Normal speech and language. No gross or focal neurologic deficits are appreciated. Skin:  Skin is warm, dry and intact. No rash noted. Psychiatric: Mood and affect are normal. Speech and behavior are normal. Patient exhibits appropriate insight and judgment.   ____________________________________________  LABS (pertinent positives/negatives) I, Governor Rooks, MD the attending physician have reviewed the labs noted below.  Labs Reviewed  PROTIME-INR    ____________________________________________    EKG I, Governor Rooks, MD, the attending physician have personally viewed and interpreted all ECGs.  None ____________________________________________  RADIOLOGY All Xrays were viewed by me.  Imaging interpreted by Radiologist, and I, Governor Rooks, MD the attending physician have reviewed the radiologist interpretation noted below.  right lower extremity ultrasound:IMPRESSION: No evidence of DVT within the right lower extremity. __________________________________________  PROCEDURES  Procedure(s) performed: None  Critical Care performed: None  ____________________________________________  No current facility-administered medications on file prior to encounter.    Current Outpatient Prescriptions on File Prior to Encounter  Medication Sig Dispense Refill  . loratadine (CLARITIN) 10 MG tablet Take 10 mg by mouth daily as needed for allergies.    Marland Kitchen meclizine (ANTIVERT) 25 MG tablet Take 1 tablet (25 mg total) by mouth 3 (three) times daily as needed for dizziness. (Patient not taking: Reported on 06/07/2015) 30 tablet 0  . warfarin (COUMADIN) 10 MG tablet Take 10 mg by mouth  daily.      ____________________________________________  ED COURSE / ASSESSMENT AND PLAN  Pertinent labs & imaging results that were available during my care of the patient were reviewed by me and considered in my medical decision making (see chart for details).    ultrasound was ordered by protocol from triage and was reviewed and negative for DVT.  On physical examination the lump the patient was concerned about is actually a hematoma associated with ecchymosis, suspected from potentially even minor trauma given the fact that she is on Coumadin.  No other areas of easy bruising or bleeding. I don't think that we need to check for anemia or from both cytopenia at this point.  Because she has not had her INR checked recently, I am going to go ahead and check that now.  INR 12 -- given po vitamin k given how high.  Hold coumadin x weekend and see doctor for repeat inr on Monday.   DIFFERENTIAL DIAGNOSIS: including but not limited to ligamentous injury, cartilage injury, Baker cyst, DVT, hematoma  CONSULTATIONS:   None  Addended  to include - patient knee does not look swollen or with effusion.  Do not suspect bleeding in joint at this point.  Discussed return precautions with respect to this.   Patient / Family / Caregiver informed of clinical course, medical decision-making process, and agree with plan.   I discussed return precautions, follow-up instructions, and discharge instructions with patient and/or family.  Discharge Instructions : You were evaluated for right knee and right thigh pain with lump, and no serious condition is suspect at this point time.  The bruising associated hematoma/lump will probably resolve within a couple of weeks on its own.  Your knee pain may be related to ligamentous strain, and if it is not improved after a week or so, please see your primary care doctor and may consider advanced imaging such as MRI to look at the ligaments and  cartilages.  Return to the emergency room immediately for any worsening condition including leg swelling, leg pain, skin rash, trouble breathing, other areas of easy bruising or bleeding, or any other symptoms concerning to you.   Your INR was elevated at 12 (goal should be 2-3).  You were given pill to help reverse and lower the level.  Do not take warfarin on Saturday and Sunday.   You need to have your INR drawn on Monday to determine if you are to resume warfarin. ___________________________________________   FINAL CLINICAL IMPRESSION(S) / ED DIAGNOSES   Final diagnoses:  Acute pain of right knee  Hematoma of right thigh, initial encounter              Note: This dictation was prepared with Dragon dictation. Any transcriptional errors that result from this process are unintentional    Governor Rooks, MD 11/21/16 4098    Governor Rooks, MD 11/21/16 [phone]

## 2016-11-21 NOTE — ED Notes (Signed)
Patient transported to Ultrasound 

## 2016-11-21 NOTE — ED Triage Notes (Signed)
Pt to triage via w/c with no distress noted; st tonight noted knot to back of right knee and to outer thigh; st concerned over blood clot

## 2016-11-23 DIAGNOSIS — Z87891 Personal history of nicotine dependence: Secondary | ICD-10-CM | POA: Insufficient documentation

## 2016-11-23 DIAGNOSIS — Z79899 Other long term (current) drug therapy: Secondary | ICD-10-CM | POA: Insufficient documentation

## 2016-11-23 DIAGNOSIS — R791 Abnormal coagulation profile: Secondary | ICD-10-CM | POA: Insufficient documentation

## 2016-11-23 DIAGNOSIS — T148XXD Other injury of unspecified body region, subsequent encounter: Secondary | ICD-10-CM | POA: Diagnosis not present

## 2016-11-23 DIAGNOSIS — Y69 Unspecified misadventure during surgical and medical care: Secondary | ICD-10-CM | POA: Insufficient documentation

## 2016-11-23 DIAGNOSIS — Z7901 Long term (current) use of anticoagulants: Secondary | ICD-10-CM | POA: Diagnosis not present

## 2016-11-23 LAB — PROTIME-INR
INR: 4.89 — AB
PROTHROMBIN TIME: 45.3 s — AB (ref 11.4–15.2)

## 2016-11-23 NOTE — ED Notes (Addendum)
Pt here for recheck of blood work; seen here 11/21/16 and was to have PT/INR repeated Monday at her doctor's office; pt says she doesn't have an appointment with him for several weeks so she came back here for recheck; pt says she has been bruising easily

## 2016-11-23 NOTE — ED Notes (Signed)
Dr. Marisa Severin notified regarding pt's critical INR of 4.89 called from lab. No new orders received.

## 2016-11-23 NOTE — ED Triage Notes (Signed)
Pt states she is here to have her INR checked. Pt states her INR was 12 on 11/21/2016. Pt states her doctor told her to come to ed and check her INR. Pt states last coumadin was Friday am.

## 2016-11-24 ENCOUNTER — Emergency Department
Admission: EM | Admit: 2016-11-24 | Discharge: 2016-11-24 | Disposition: A | Discharge status: Home / Self Care | Payer: BLUE CROSS/BLUE SHIELD | Attending: Emergency Medicine | Admitting: Emergency Medicine

## 2016-11-24 DIAGNOSIS — R791 Abnormal coagulation profile: Secondary | ICD-10-CM

## 2016-11-24 DIAGNOSIS — T148XXA Other injury of unspecified body region, initial encounter: Secondary | ICD-10-CM

## 2016-11-24 NOTE — ED Notes (Signed)
Patient reports came to have INR rechecked because she would not be able to get in with her doctor.  Patient reports seen at [facility] on 9/28 and INR was elevated and it needed to be rechecked, patient history of DVT and difficulty regulating INR.  Patient reports bruising more easily lately, last coumadin dose was on Friday.

## 2016-11-24 NOTE — ED Notes (Signed)
Pt updated on delay. Pt verbalizes understanding.  

## 2016-11-24 NOTE — Discharge Instructions (Signed)
Please follow-up with hematology for further management of your elevated INR.
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
## 2017-01-09 ENCOUNTER — Encounter: Payer: Self-pay | Admitting: Cardiovascular Disease

## 2017-02-10 ENCOUNTER — Encounter: Payer: Self-pay | Admitting: Cardiovascular Disease

## 2017-02-10 ENCOUNTER — Ambulatory Visit (INDEPENDENT_AMBULATORY_CARE_PROVIDER_SITE_OTHER): Payer: BLUE CROSS/BLUE SHIELD | Admitting: Cardiovascular Disease

## 2017-02-10 DIAGNOSIS — Z5181 Encounter for therapeutic drug level monitoring: Secondary | ICD-10-CM

## 2017-02-10 DIAGNOSIS — I82503 Chronic embolism and thrombosis of unspecified deep veins of lower extremity, bilateral: Secondary | ICD-10-CM | POA: Diagnosis not present

## 2017-02-10 DIAGNOSIS — Z7901 Long term (current) use of anticoagulants: Secondary | ICD-10-CM

## 2017-02-10 NOTE — Progress Notes (Signed)
Cardiology Office Note   Date:  02/10/2017   ID:  Lilyan Gilfordammy Glade, DOB [birth year], MRN [id number]  PCP:  Sharilyn SitesHeffington, Mark, MD  Cardiologist:   Lorine BearsMuhammad Arida, MD   Chief Complaint  Patient presents with  . OTHER    Est. care pt needs coumadin management c/o fluid retention/swelling. Meds reviewed verbally with pt.      History of Present Illness: Angel Campbell is a 46 y.o. female who is self-referred for anticoagulation management.  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  Lifelong anticoagulation was recommended at that time and she has been taking warfarin.  She had recent significant fluctuations in INR and thus she decided to change to our clinic.  She denies any chest pain, shortness of breath or palpitations. She used to run marathons in the past but not recently.  She currently works at [employer]. There is no family history of premature coronary artery disease.  Her mother has atrial fibrillation.    Past Medical History:  Diagnosis Date  . COPD (chronic obstructive pulmonary disease) (HCC)   . DVT of deep femoral vein (HCC) 2004  . Pulmonary embolism (HCC)   . Vertigo     Past Surgical History:  Procedure Laterality Date  . arm surgery    . CESAREAN SECTION    . CHOLECYSTECTOMY    . TOE SURGERY  May 2015     Current Outpatient Medications  Medication Sig Dispense Refill  . warfarin (COUMADIN) 10 MG tablet Take 10-15 mg by mouth daily. Takes 15 mg on mon and fridays 10 mg on all other days.     No current facility-administered medications for this visit.     Allergies:   Sodium hypochlorite and Tape    Social History:  The patient  reports that she has quit smoking. she has never used smokeless tobacco. She reports that she does not drink alcohol or use drugs.   Family History:  The patient's family history includes AAA (abdominal aortic aneurysm) in her father; Arrhythmia in her mother; Diabetes in her  mother; Stroke in her mother.    ROS:  Please see the history of present illness.   Otherwise, review of systems are positive for none.   All other systems are reviewed and negative.    PHYSICAL EXAM: VS:  BP 116/80 (BP Location: Right Arm, Patient Position: Sitting, Cuff Size: Large)   Pulse 68   Ht 5\' 9"  (1.753 m)   Wt 295 lb 4 oz (133.9 kg)   BMI 43.60 kg/m  , BMI Body mass index is 43.6 kg/m. GEN: Well nourished, well developed, in no acute distress  HEENT: normal  Neck: no JVD, carotid bruits, or masses Cardiac: RRR; no murmurs, rubs, or gallops,no edema  Respiratory:  clear to auscultation bilaterally, normal work of breathing GI: soft, nontender, nondistended, + BS MS: no deformity or atrophy  Skin: warm and dry, no rash Neuro:  Strength and sensation are intact Psych: euthymic mood, full affect   EKG:  EKG is ordered today. The ekg ordered today demonstrates normal sinus rhythm with no significant ST or T wave changes.   Recent Labs: 05/01/2016: ALT 39; BUN 11; Creatinine, Ser 0.70; Hemoglobin 14.1; Platelets 269; Potassium 3.8; Sodium 137    Lipid Panel    Component Value Date/Time   CHOL 199 12/07/2013   CHOL 202 (H) 07/30/2013 0425   TRIG 147 12/07/2013   TRIG 201 (H) 07/30/2013 0425  HDL 50 12/07/2013   HDL 38 (L) 07/30/2013 0425   VLDL 40 07/30/2013 0425   LDLCALC 120 12/07/2013   LDLCALC 124 (H) 07/30/2013 0425      Wt Readings from Last 3 Encounters:  02/10/17 295 lb 4 oz (133.9 kg)  11/23/16 (!) 320 lb (145.2 kg)  11/21/16 (!) 320 lb (145.2 kg)       PAD Screen 02/10/2017  Previous PAD dx? No  Previous surgical procedure? No  Pain with walking? Yes  Subsides with rest? No  Feet/toe relief with dangling? No  Painful, non-healing ulcers? No  Extremities discolored? No      ASSESSMENT AND PLAN:  1.  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  Not entirely clear if she has hypercoagulable state. Continue  anticoagulation with warfarin with a target INR between 2 and 3.  2.  Obesity: She wants to start exercising again.  We discussed importance of healthy diet and exercise.    Disposition:   FU with me in 1 year  Signed,  Lorine BearsMuhammad Arida, MD  02/10/2017 2:48 PM    [facility]

## 2017-02-10 NOTE — Patient Instructions (Signed)
Medication Instructions:  Your physician recommends that you continue on your current medications as directed. Please refer to the Current Medication list given to you today.   Labwork: none  Testing/Procedures: none  Follow-Up: Your physician wants you to follow-up in: 1 year with Dr. Kirke CorinArida. You will receive a reminder letter in the mail two months in advance. If you don't receive a letter, please call our office to schedule the follow-up appointment.   Any Other Special Instructions Will Be Listed Below (If Applicable).  Appointment with coumadin clinic to establish care.    If you need a refill on your cardiac medications before your next appointment, please call your pharmacy.

## 2017-02-11 ENCOUNTER — Ambulatory Visit (INDEPENDENT_AMBULATORY_CARE_PROVIDER_SITE_OTHER): Payer: BLUE CROSS/BLUE SHIELD

## 2017-02-11 DIAGNOSIS — I8290 Acute embolism and thrombosis of unspecified vein: Secondary | ICD-10-CM

## 2017-02-11 DIAGNOSIS — Z7901 Long term (current) use of anticoagulants: Secondary | ICD-10-CM

## 2017-02-11 DIAGNOSIS — Z5181 Encounter for therapeutic drug level monitoring: Secondary | ICD-10-CM

## 2017-02-11 LAB — POCT INR: INR: 2.1

## 2017-02-11 NOTE — Patient Instructions (Signed)
Please continue current dosage of 10 mg every day except 5 mg on Mondays, Wednesdays, Fridays. Recheck in 2 weeks.

## 2017-02-13 NOTE — Addendum Note (Signed)
Addended by: Festus AloeRESPO, SHARON G on: 02/13/2017 10:04 AM   Modules accepted: Orders

## 2017-02-25 ENCOUNTER — Ambulatory Visit (INDEPENDENT_AMBULATORY_CARE_PROVIDER_SITE_OTHER): Payer: BLUE CROSS/BLUE SHIELD

## 2017-02-25 DIAGNOSIS — Z7901 Long term (current) use of anticoagulants: Secondary | ICD-10-CM | POA: Diagnosis not present

## 2017-02-25 DIAGNOSIS — I8290 Acute embolism and thrombosis of unspecified vein: Secondary | ICD-10-CM

## 2017-02-25 DIAGNOSIS — Z5181 Encounter for therapeutic drug level monitoring: Secondary | ICD-10-CM

## 2017-02-25 LAB — POCT INR: INR: 2.3

## 2017-02-25 NOTE — Patient Instructions (Signed)
Please continue current dosage of 10 mg every day except 5 mg on Mondays, Wednesdays, Fridays. Recheck in 3 weeks.

## 2017-03-06 ENCOUNTER — Ambulatory Visit: Payer: Self-pay | Admitting: Cardiovascular Disease

## 2017-03-09 ENCOUNTER — Ambulatory Visit: Payer: Self-pay | Admitting: Cardiovascular Disease

## 2017-03-16 ENCOUNTER — Other Ambulatory Visit: Payer: Self-pay

## 2017-03-16 ENCOUNTER — Emergency Department: Payer: BLUE CROSS/BLUE SHIELD

## 2017-03-16 ENCOUNTER — Emergency Department
Admission: EM | Admit: 2017-03-16 | Discharge: 2017-03-16 | Disposition: A | Discharge status: Home / Self Care | Payer: BLUE CROSS/BLUE SHIELD | Attending: Emergency Medicine | Admitting: Emergency Medicine

## 2017-03-16 DIAGNOSIS — M7661 Achilles tendinitis, right leg: Secondary | ICD-10-CM | POA: Diagnosis not present

## 2017-03-16 DIAGNOSIS — X58XXXA Exposure to other specified factors, initial encounter: Secondary | ICD-10-CM | POA: Insufficient documentation

## 2017-03-16 DIAGNOSIS — M79662 Pain in left lower leg: Secondary | ICD-10-CM

## 2017-03-16 DIAGNOSIS — Y9301 Activity, walking, marching and hiking: Secondary | ICD-10-CM | POA: Diagnosis not present

## 2017-03-16 DIAGNOSIS — Z87891 Personal history of nicotine dependence: Secondary | ICD-10-CM | POA: Diagnosis not present

## 2017-03-16 DIAGNOSIS — J449 Chronic obstructive pulmonary disease, unspecified: Secondary | ICD-10-CM | POA: Diagnosis not present

## 2017-03-16 DIAGNOSIS — M79604 Pain in right leg: Secondary | ICD-10-CM | POA: Diagnosis present

## 2017-03-16 DIAGNOSIS — M7989 Other specified soft tissue disorders: Secondary | ICD-10-CM

## 2017-03-16 DIAGNOSIS — E119 Type 2 diabetes mellitus without complications: Secondary | ICD-10-CM | POA: Diagnosis not present

## 2017-03-16 DIAGNOSIS — Y929 Unspecified place or not applicable: Secondary | ICD-10-CM | POA: Diagnosis not present

## 2017-03-16 DIAGNOSIS — M7660 Achilles tendinitis, unspecified leg: Secondary | ICD-10-CM

## 2017-03-16 DIAGNOSIS — Y999 Unspecified external cause status: Secondary | ICD-10-CM | POA: Diagnosis not present

## 2017-03-16 MED ORDER — KETOROLAC TROMETHAMINE 60 MG/2ML IM SOLN
60.0000 mg | Freq: Once | INTRAMUSCULAR | Status: AC
  Administered 2017-03-16: 60 mg via INTRAMUSCULAR
  Filled 2017-03-16: qty 2

## 2017-03-16 MED ORDER — TRAMADOL HCL 50 MG PO TABS: 50.0000 mg | ORAL_TABLET | Freq: Four times a day (QID) | ORAL | 0 refills | Status: DC | PRN

## 2017-03-16 MED ORDER — OXYCODONE-ACETAMINOPHEN 5-325 MG PO TABS
2.0000 | ORAL_TABLET | Freq: Once | ORAL | Status: AC
  Administered 2017-03-16: 2 via ORAL
  Filled 2017-03-16: qty 2

## 2017-03-16 NOTE — ED Triage Notes (Signed)
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  Patient reports history of DVTs.

## 2017-03-16 NOTE — ED Notes (Signed)
NAD noted at time of D/C. Pt taken to lobby via wheelchair by this RN. Pt denies comments/concerns regarding D/C instructions.  

## 2017-03-16 NOTE — ED Provider Notes (Signed)
[facility] Emergency Department Provider Note   ____________________________________________   First MD Initiated Contact with Patient 03/16/17 0600     (approximate)  I have reviewed the triage vital signs and the nursing notes.   HISTORY  Chief Complaint Leg Injury    HPI Angel Campbell is a 47 y.o. female who comes into the hospital today with some right calf pain.  The patient reports that she was walking pushing some carts and she felt a pop in the back of her calf.  The patient reports that she has pain when she is walking on her leg.  She has a history of DVT so she was unsure if that had anything to do with her pain.  The patient was walking up an incline when this occurred.  The patient states that she noticed some swelling to her calf and some tenderness above the area of the Achilles.  The patient rates her pain a 9 out of 10 in intensity.  The patient reports that it is worse when she puts pressure on it.  She has not taken anything for pain.  She tried to elevate and ice the area but noticed some tingling in her big toe so she stopped.  The patient is here today for evaluation.   Past Medical History:  Diagnosis Date  . COPD (chronic obstructive pulmonary disease) (HCC)   . DVT of deep femoral vein (HCC) 2004  . Pulmonary embolism (HCC)   . Vertigo     Patient Active Problem List   Diagnosis Date Noted  . Encounter for monitoring Coumadin therapy 02/10/2017  . 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 Pulmonary emboli (HCC) 06/07/2015  . Thyroid nodule 06/07/2015    Past Surgical History:  Procedure Laterality Date  . arm surgery    . CESAREAN SECTION    . CHOLECYSTECTOMY    . TOE SURGERY  May 2015    Prior to Admission medications   Medication Sig Start Date End Date Taking? Authorizing Provider  warfarin (COUMADIN) 10 MG tablet Take 10-15 mg by mouth daily. Takes 15 mg on mon and fridays 10 mg on all other days. 04/09/16   Yes [provider]  traMADol (ULTRAM) 50 MG tablet Take 1 tablet (50 mg total) by mouth every 6 (six) hours as needed. 03/16/17   Rebecka Apley, MD    Allergies Sodium hypochlorite and Tape  Family History  Problem Relation Age of Onset  . Diabetes Mother   . Stroke Mother   . Arrhythmia Mother   . AAA (abdominal aortic aneurysm) Father     Social History Social History   Tobacco Use  . Smoking status: Former Games developer  . Smokeless tobacco: Never Used  Substance Use Topics  . Alcohol use: No  . Drug use: No    Review of Systems  Constitutional: No fever/chills Eyes: No visual changes. ENT: No sore throat. Cardiovascular: Denies chest pain. Respiratory: Denies shortness of breath. Gastrointestinal: No abdominal pain.  No nausea, no vomiting.  No diarrhea.  No constipation. Genitourinary: Negative for dysuria. Musculoskeletal: Right leg pain Skin: Negative for rash. Neurological: Negative for headaches, focal weakness or numbness.   ____________________________________________   PHYSICAL EXAM:  VITAL SIGNS: ED Triage Vitals  Enc Vitals Group     BP 03/16/17 0437 (!) 168/80     Pulse Rate 03/16/17 0437 73     Resp 03/16/17 0437 20     Temp 03/16/17 0437 97.7 F (36.5 C)  Temp Source 03/16/17 0437 Oral     SpO2 --      Weight 03/16/17 0434 280 lb (127 kg)     Height 03/16/17 0434 5\' 9"  (1.753 m)     Head Circumference --      Peak Flow --      Pain Score 03/16/17 0434 7     Pain Loc --      Pain Edu? --      Excl. in GC? --     Constitutional: Alert and oriented. Well appearing and in moderate distress. Eyes: Conjunctivae are normal. PERRL. EOMI. Head: Atraumatic. Nose: No congestion/rhinnorhea. Mouth/Throat: Mucous membranes are moist.  Oropharynx non-erythematous. Cardiovascular: Normal rate, regular rhythm. Grossly normal heart sounds.  Good peripheral circulation. Respiratory: Normal respiratory effort.  No retractions. Lungs  CTAB. Gastrointestinal: Soft and nontender. No distention.  Musculoskeletal: Tenderness to palpation distal right calf with some mild swelling, pain with dorsiflexion of right foot Neurologic:  Normal speech and language.  Skin:  Skin is warm, dry and intact. Psychiatric: Mood and affect are normal.   ____________________________________________   LABS (all labs ordered are listed, but only abnormal results are displayed)  Labs Reviewed - No data to display ____________________________________________  EKG  none ____________________________________________  RADIOLOGY  Dg Tibia/fibula Right  Result Date: 03/16/2017 CLINICAL DATA:  Right calf pain.  Felt a pop.  History of DVTs. EXAM: RIGHT TIBIA AND FIBULA - 2 VIEW COMPARISON:  None. FINDINGS: The tibia and fibula appear intact without evidence of acute fracture. The knee and ankle are grossly located with mild to moderate degenerative spurring noted. Moderately large posterior and plantar calcaneal enthesophytes are present. Tubular densities in the subcutaneous soft tissues of the calf suggest varicosities. IMPRESSION: No acute osseous abnormality. Electronically Signed   By: Sebastian Ache M.D.   On: 03/16/2017 07:24   US Venous Img Lower Unilateral Right  Result Date: 03/16/2017 CLINICAL DATA:  Pain and swelling in the right lower extremity. Patient felt a pop tonight while at work. History of previous DVT to the left side. Warfarin anticoagulation therapy. EXAM: Right LOWER EXTREMITY VENOUS DOPPLER ULTRASOUND TECHNIQUE: Gray-scale sonography with graded compression, as well as color Doppler and duplex ultrasound were performed to evaluate the lower extremity deep venous systems from the level of the common femoral vein and including the common femoral, femoral, profunda femoral, popliteal and calf veins including the posterior tibial, peroneal and gastrocnemius veins when visible. The superficial great saphenous vein was also  interrogated. Spectral Doppler was utilized to evaluate flow at rest and with distal augmentation maneuvers in the common femoral, femoral and popliteal veins. COMPARISON:  None. FINDINGS: Contralateral Common Femoral Vein: Thin linear echogenic structure seen centrally in the vessel. This could represent sequela of old chronic DVT but the vessel remains widely patent with flow around the area. Common Femoral Vein: No evidence of thrombus. Normal compressibility, respiratory phasicity and response to augmentation. Saphenofemoral Junction: No evidence of thrombus. Normal compressibility and flow on color Doppler imaging. Profunda Femoral Vein: No evidence of thrombus. Normal compressibility and flow on color Doppler imaging. Femoral Vein: No evidence of thrombus. Normal compressibility, respiratory phasicity and response to augmentation. Popliteal Vein: No evidence of thrombus. Normal compressibility, respiratory phasicity and response to augmentation. Calf Veins: No evidence of thrombus. Normal compressibility and flow on color Doppler imaging. Superficial Great Saphenous Vein: No evidence of thrombus. Normal compressibility. Venous Reflux:  None. Other Findings:  None. IMPRESSION: No evidence of acute deep venous thrombosis in the  right lower extremity veins. Thin linear echogenic structure in the contralateral left femoral vein could represent sequela of old chronic DVT. No acute DVT. Electronically Signed   By: Burman NievesWilliam  Stevens M.D.   On: 03/16/2017 06:52    ____________________________________________   PROCEDURES  Procedure(s) performed: None  Procedures  Critical Care performed: No  ____________________________________________   INITIAL IMPRESSION / ASSESSMENT AND PLAN / ED COURSE  As part of my medical decision making, I reviewed the following data within the electronic MEDICAL RECORD NUMBER Notes from prior ED visits and [place] Controlled Substance Database   This is a 47 year old female who  comes into the hospital today with some pain to her distal right calf after walking and hearing a pop.  The patient was sent for an ultrasound which was unremarkable for DVT.  The patient also had an x-ray which is unremarkable.  I feel that the patient may have some Achilles tendinopathy or some muscle strain.  I will place the patient in a splint and I will place her on some crutches.  She did receive a dose of Percocet as well as a dose of Toradol.  She will be discharged to follow-up with orthopedic surgery.  The patient has no further questions or concerns at this time.      ____________________________________________   FINAL CLINICAL IMPRESSION(S) / ED DIAGNOSES  Final diagnoses:  Achilles tendon pain  Pain of right lower extremity     ED Discharge Orders        Ordered    traMADol (ULTRAM) 50 MG tablet  Every 6 hours PRN     03/16/17 0730       Note:  This document was prepared using Dragon voice recognition software and may include unintentional dictation errors.   Rebecka ApleyWebster, Findley Blankenbaker P, MD 03/16/17 [phone]

## 2017-03-16 NOTE — ED Notes (Signed)
Received report from Rebecca,RN.

## 2017-03-16 NOTE — Discharge Instructions (Signed)
Please follow up with orthopedic surgery for further evaluation °

## 2017-03-18 ENCOUNTER — Ambulatory Visit (INDEPENDENT_AMBULATORY_CARE_PROVIDER_SITE_OTHER): Payer: BLUE CROSS/BLUE SHIELD

## 2017-03-18 DIAGNOSIS — Z5181 Encounter for therapeutic drug level monitoring: Secondary | ICD-10-CM | POA: Diagnosis not present

## 2017-03-18 DIAGNOSIS — I8290 Acute embolism and thrombosis of unspecified vein: Secondary | ICD-10-CM | POA: Diagnosis not present

## 2017-03-18 DIAGNOSIS — Z7901 Long term (current) use of anticoagulants: Secondary | ICD-10-CM | POA: Diagnosis not present

## 2017-03-18 LAB — POCT INR: INR: 1.1

## 2017-03-18 NOTE — Patient Instructions (Signed)
Please take extra 1/2 dose today, tomorrow and Friday, then resume previous dosage of 10 mg every day except 5 mg on Mondays, Wednesdays, Fridays. Recheck in 3 weeks.

## 2017-04-22 ENCOUNTER — Ambulatory Visit (INDEPENDENT_AMBULATORY_CARE_PROVIDER_SITE_OTHER): Payer: BLUE CROSS/BLUE SHIELD

## 2017-04-22 DIAGNOSIS — Z5181 Encounter for therapeutic drug level monitoring: Secondary | ICD-10-CM | POA: Diagnosis not present

## 2017-04-22 DIAGNOSIS — Z7901 Long term (current) use of anticoagulants: Secondary | ICD-10-CM | POA: Diagnosis not present

## 2017-04-22 DIAGNOSIS — I8290 Acute embolism and thrombosis of unspecified vein: Secondary | ICD-10-CM

## 2017-04-22 LAB — POCT INR: INR: 1.5

## 2017-04-22 NOTE — Patient Instructions (Signed)
Please take extra 1/2 dose today, tomorrow and Friday, then start new dosage of 10 mg every day except 5 mg on Mondays & Fridays. Recheck in 2 weeks.

## 2017-05-06 ENCOUNTER — Ambulatory Visit (INDEPENDENT_AMBULATORY_CARE_PROVIDER_SITE_OTHER): Payer: BLUE CROSS/BLUE SHIELD

## 2017-05-06 DIAGNOSIS — I8290 Acute embolism and thrombosis of unspecified vein: Secondary | ICD-10-CM

## 2017-05-06 DIAGNOSIS — Z5181 Encounter for therapeutic drug level monitoring: Secondary | ICD-10-CM | POA: Diagnosis not present

## 2017-05-06 DIAGNOSIS — Z7901 Long term (current) use of anticoagulants: Secondary | ICD-10-CM | POA: Diagnosis not present

## 2017-05-06 LAB — POCT INR: INR: 3.6

## 2017-05-06 NOTE — Patient Instructions (Signed)
Please skip coumadin today, then continue dosage of 10 mg every day except 5 mg on Mondays & Fridays. Recheck in 2 weeks.

## 2017-05-20 ENCOUNTER — Ambulatory Visit (INDEPENDENT_AMBULATORY_CARE_PROVIDER_SITE_OTHER): Payer: BLUE CROSS/BLUE SHIELD

## 2017-05-20 DIAGNOSIS — Z5181 Encounter for therapeutic drug level monitoring: Secondary | ICD-10-CM

## 2017-05-20 DIAGNOSIS — I8290 Acute embolism and thrombosis of unspecified vein: Secondary | ICD-10-CM | POA: Diagnosis not present

## 2017-05-20 DIAGNOSIS — Z7901 Long term (current) use of anticoagulants: Secondary | ICD-10-CM

## 2017-05-20 LAB — POCT INR: INR: 2.4

## 2017-05-20 NOTE — Patient Instructions (Signed)
Please continue dosage of 10 mg every day except 5 mg on Mondays & Fridays. Recheck in 4 weeks.

## 2017-06-17 ENCOUNTER — Ambulatory Visit (INDEPENDENT_AMBULATORY_CARE_PROVIDER_SITE_OTHER): Payer: BLUE CROSS/BLUE SHIELD

## 2017-06-17 ENCOUNTER — Other Ambulatory Visit: Payer: Self-pay

## 2017-06-17 DIAGNOSIS — Z7901 Long term (current) use of anticoagulants: Secondary | ICD-10-CM

## 2017-06-17 DIAGNOSIS — Z5181 Encounter for therapeutic drug level monitoring: Secondary | ICD-10-CM | POA: Diagnosis not present

## 2017-06-17 DIAGNOSIS — I8290 Acute embolism and thrombosis of unspecified vein: Secondary | ICD-10-CM | POA: Diagnosis not present

## 2017-06-17 LAB — POCT INR: INR: 0.9

## 2017-06-17 MED ORDER — WARFARIN SODIUM 10 MG PO TABS: 10.0000 mg | ORAL_TABLET | ORAL | 3 refills | Status: DC

## 2017-06-17 NOTE — Patient Instructions (Signed)
Please take 1.5 tablets today and tomorrow, then continue dosage of 10 mg every day except 5 mg on Mondays & Fridays. Recheck in 2 weeks.

## 2017-07-08 ENCOUNTER — Ambulatory Visit (INDEPENDENT_AMBULATORY_CARE_PROVIDER_SITE_OTHER): Payer: BLUE CROSS/BLUE SHIELD

## 2017-07-08 DIAGNOSIS — I8290 Acute embolism and thrombosis of unspecified vein: Secondary | ICD-10-CM

## 2017-07-08 DIAGNOSIS — Z7901 Long term (current) use of anticoagulants: Secondary | ICD-10-CM

## 2017-07-08 DIAGNOSIS — Z5181 Encounter for therapeutic drug level monitoring: Secondary | ICD-10-CM

## 2017-07-08 LAB — POCT INR: INR: 3

## 2017-07-08 NOTE — Patient Instructions (Signed)
Please continue dosage of 10 mg every day except 5 mg on Mondays & Fridays. Recheck in 3 weeks.

## 2017-07-29 ENCOUNTER — Ambulatory Visit (INDEPENDENT_AMBULATORY_CARE_PROVIDER_SITE_OTHER): Payer: BLUE CROSS/BLUE SHIELD

## 2017-07-29 DIAGNOSIS — Z7901 Long term (current) use of anticoagulants: Secondary | ICD-10-CM | POA: Diagnosis not present

## 2017-07-29 DIAGNOSIS — I8290 Acute embolism and thrombosis of unspecified vein: Secondary | ICD-10-CM | POA: Diagnosis not present

## 2017-07-29 DIAGNOSIS — Z5181 Encounter for therapeutic drug level monitoring: Secondary | ICD-10-CM | POA: Diagnosis not present

## 2017-07-29 LAB — POCT INR
INR: 1.7 — AB (ref 2.0–3.0)
INR: 1.9 — AB (ref 2.0–3.0)

## 2017-07-29 NOTE — Patient Instructions (Signed)
Please take extra 1/2 tablet tonight, then continue dosage of 10 mg every day except 5 mg on Mondays & Fridays. Recheck in 3 weeks.

## 2017-08-26 ENCOUNTER — Ambulatory Visit (INDEPENDENT_AMBULATORY_CARE_PROVIDER_SITE_OTHER): Payer: BLUE CROSS/BLUE SHIELD

## 2017-08-26 DIAGNOSIS — Z5181 Encounter for therapeutic drug level monitoring: Secondary | ICD-10-CM

## 2017-08-26 DIAGNOSIS — I8290 Acute embolism and thrombosis of unspecified vein: Secondary | ICD-10-CM | POA: Diagnosis not present

## 2017-08-26 DIAGNOSIS — Z7901 Long term (current) use of anticoagulants: Secondary | ICD-10-CM

## 2017-08-26 LAB — POCT INR: INR: 3.9 — AB (ref 2.0–3.0)

## 2017-08-26 NOTE — Patient Instructions (Signed)
Please skip coumadin tonight, then continue dosage of 10 mg every day except 5 mg on Mondays & Fridays. Recheck in 2 weeks.

## 2017-09-09 ENCOUNTER — Ambulatory Visit (INDEPENDENT_AMBULATORY_CARE_PROVIDER_SITE_OTHER): Payer: BLUE CROSS/BLUE SHIELD

## 2017-09-09 DIAGNOSIS — Z7901 Long term (current) use of anticoagulants: Secondary | ICD-10-CM

## 2017-09-09 DIAGNOSIS — Z5181 Encounter for therapeutic drug level monitoring: Secondary | ICD-10-CM | POA: Diagnosis not present

## 2017-09-09 DIAGNOSIS — I8290 Acute embolism and thrombosis of unspecified vein: Secondary | ICD-10-CM | POA: Diagnosis not present

## 2017-09-09 LAB — POCT INR: INR: 1.5 — AB (ref 2.0–3.0)

## 2017-09-09 NOTE — Patient Instructions (Signed)
Please take extra dose of coumadin tonight, then continue dosage of 10 mg every day except 5 mg on Mondays & Fridays. Recheck in 2 weeks.

## 2017-09-30 ENCOUNTER — Ambulatory Visit (INDEPENDENT_AMBULATORY_CARE_PROVIDER_SITE_OTHER): Payer: BLUE CROSS/BLUE SHIELD

## 2017-09-30 DIAGNOSIS — I8290 Acute embolism and thrombosis of unspecified vein: Secondary | ICD-10-CM | POA: Diagnosis not present

## 2017-09-30 DIAGNOSIS — Z5181 Encounter for therapeutic drug level monitoring: Secondary | ICD-10-CM

## 2017-09-30 DIAGNOSIS — Z7901 Long term (current) use of anticoagulants: Secondary | ICD-10-CM

## 2017-09-30 LAB — POCT INR: INR: 1 — AB (ref 2.0–3.0)

## 2017-09-30 MED ORDER — WARFARIN SODIUM 10 MG PO TABS: 10.0000 mg | ORAL_TABLET | ORAL | 3 refills | Status: DC

## 2017-09-30 NOTE — Patient Instructions (Signed)
Since you won't be able to pick up your pills tonight, please take 2 tablets tomorrow & Friday, then resume dosage of 10 mg every day except 5 mg on Mondays & Fridays. Recheck in 2 weeks.

## 2017-10-14 ENCOUNTER — Ambulatory Visit (INDEPENDENT_AMBULATORY_CARE_PROVIDER_SITE_OTHER): Payer: BLUE CROSS/BLUE SHIELD

## 2017-10-14 DIAGNOSIS — Z5181 Encounter for therapeutic drug level monitoring: Secondary | ICD-10-CM | POA: Diagnosis not present

## 2017-10-14 DIAGNOSIS — Z7901 Long term (current) use of anticoagulants: Secondary | ICD-10-CM

## 2017-10-14 DIAGNOSIS — I8290 Acute embolism and thrombosis of unspecified vein: Secondary | ICD-10-CM

## 2017-10-14 LAB — POCT INR: INR: 4.1 — AB (ref 2.0–3.0)

## 2017-10-14 NOTE — Patient Instructions (Signed)
Please skip coumadin tonight, then resume dosage of 10 mg every day except 5 mg on Mondays & Fridays. Recheck in 2 weeks.

## 2017-10-28 ENCOUNTER — Ambulatory Visit (INDEPENDENT_AMBULATORY_CARE_PROVIDER_SITE_OTHER): Payer: BLUE CROSS/BLUE SHIELD

## 2017-10-28 DIAGNOSIS — I8290 Acute embolism and thrombosis of unspecified vein: Secondary | ICD-10-CM

## 2017-10-28 DIAGNOSIS — Z7901 Long term (current) use of anticoagulants: Secondary | ICD-10-CM

## 2017-10-28 DIAGNOSIS — Z5181 Encounter for therapeutic drug level monitoring: Secondary | ICD-10-CM

## 2017-10-28 LAB — POCT INR: INR: 3.4 — AB (ref 2.0–3.0)

## 2017-10-28 NOTE — Patient Instructions (Signed)
Please skip coumadin tonight, then resume dosage of 10 mg every day except 5 mg on Mondays & Fridays. Recheck in 3 weeks.

## 2017-11-05 IMAGING — US US EXTREM LOW VENOUS*R*
1 series · 13 of 24 positions shown · non-contrast
Comparison: None.

CLINICAL DATA: Right lower extremity swelling.



[Series 1: us extrem low venous*right* · 0.08mm/px · 13 of 45 slices shown]
[im 1/45]
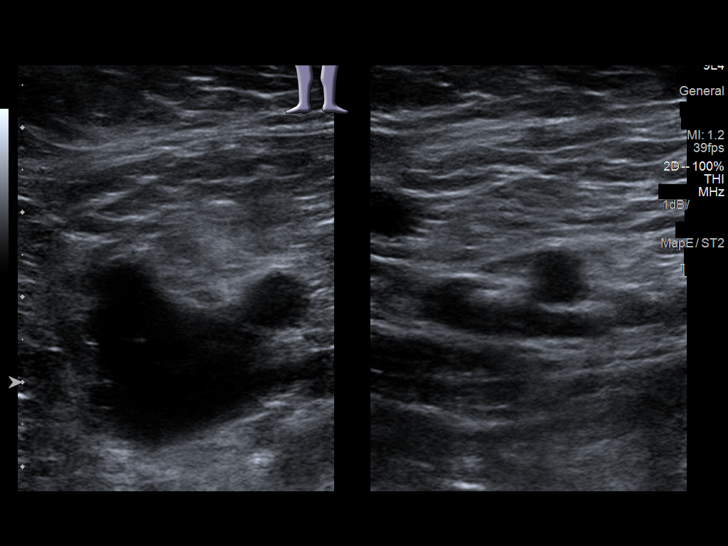
[im 4/45]
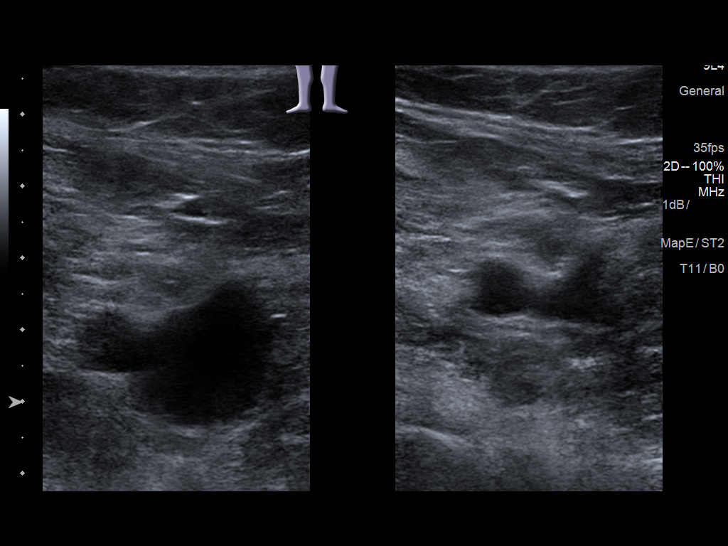
[im 8/45]
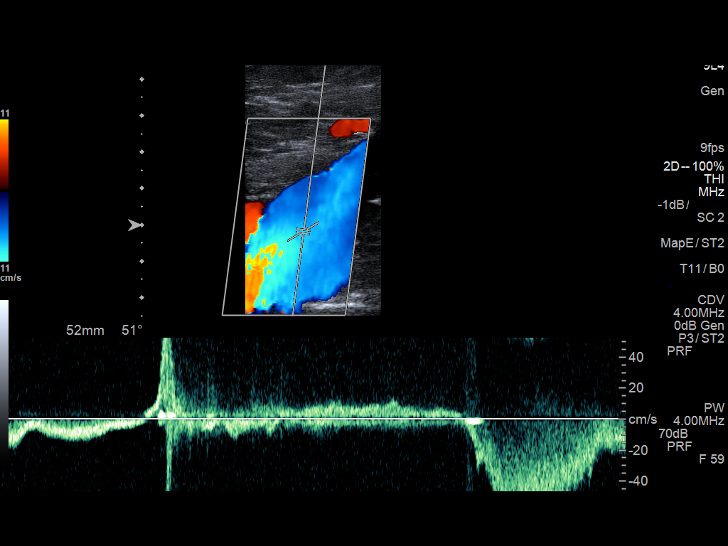
[im 12/45]
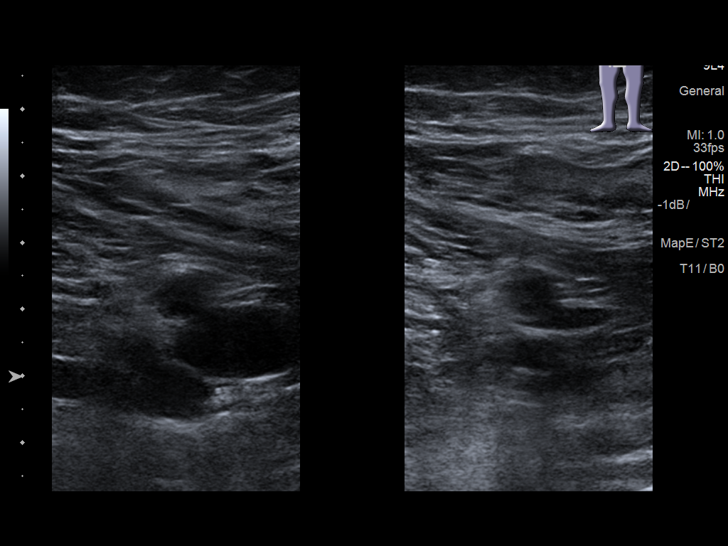
[im 16/45]
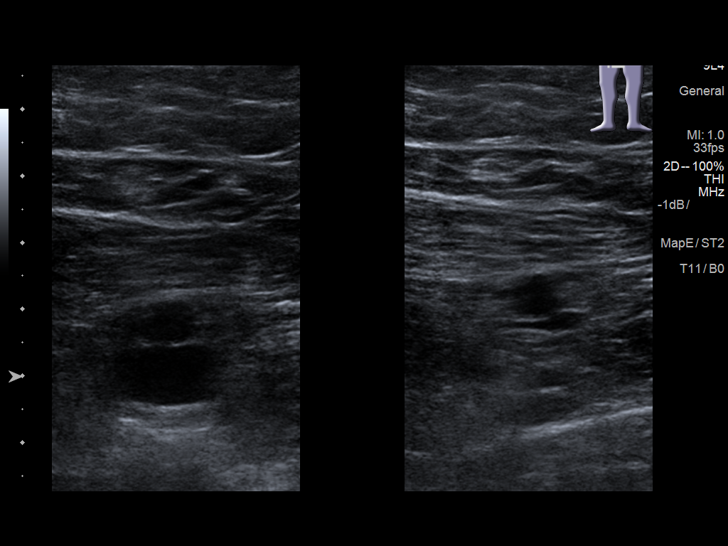
[im 20/45]
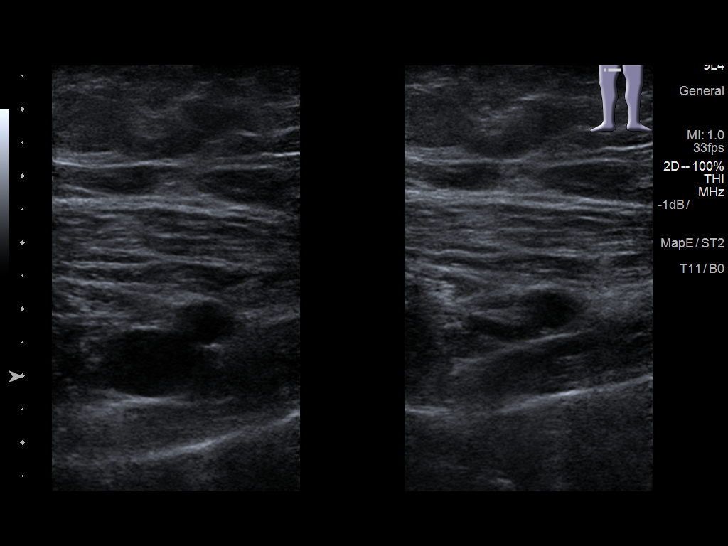
[im 23/45]
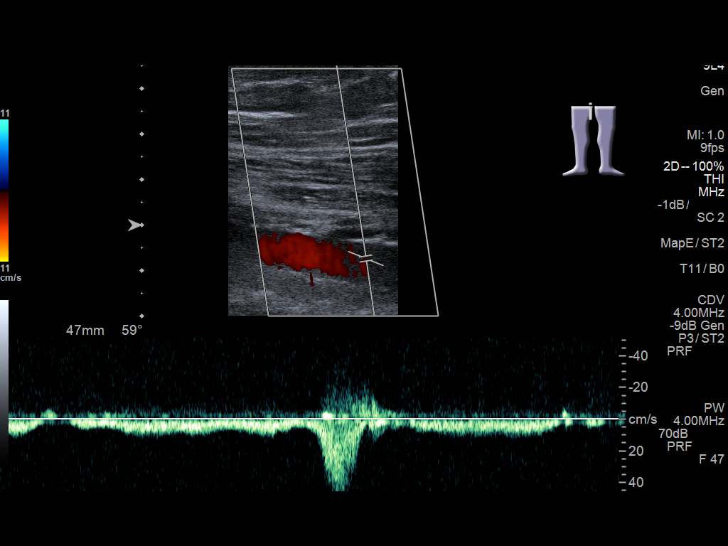
[im 25/45]
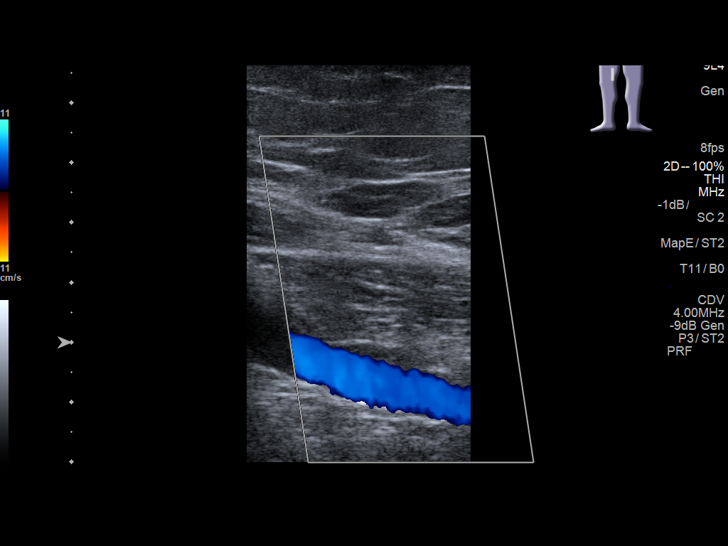
[im 29/45]
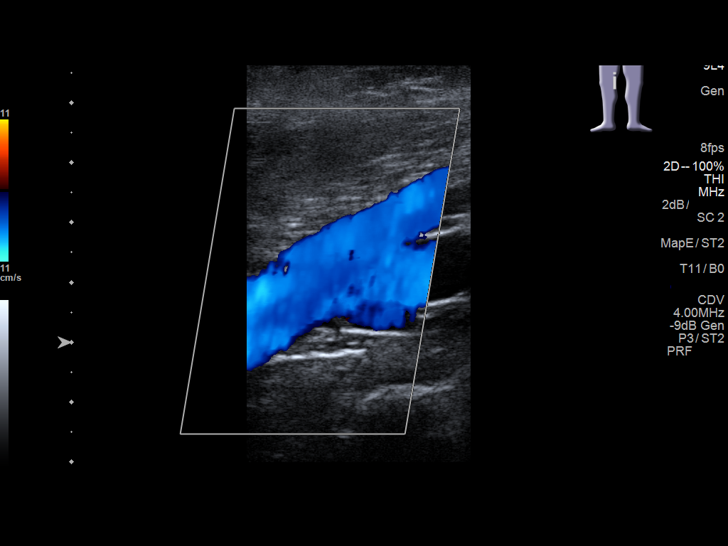
[im 33/45]
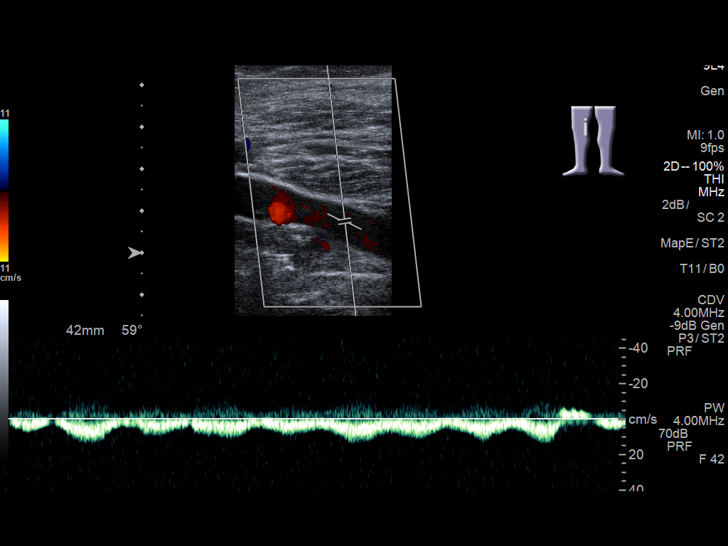
[im 37/45]
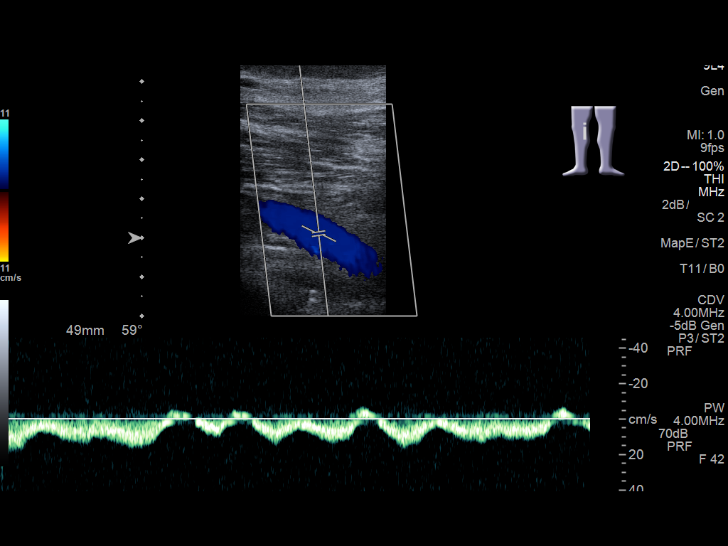
[im 41/45]
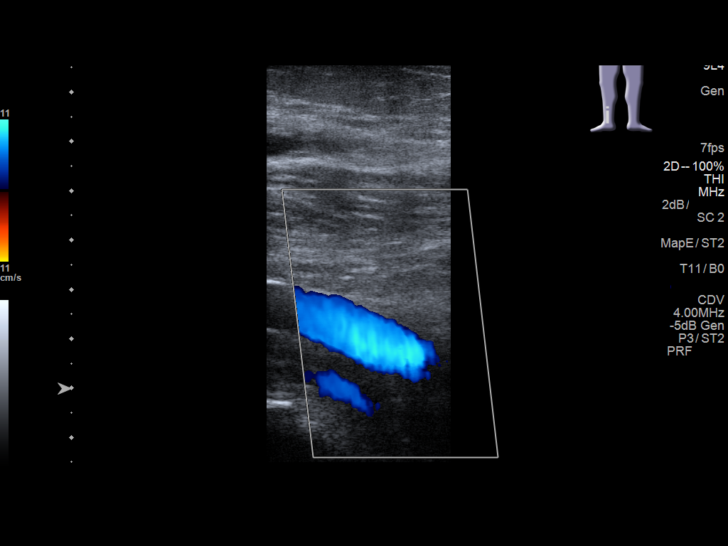
[im 45/45]
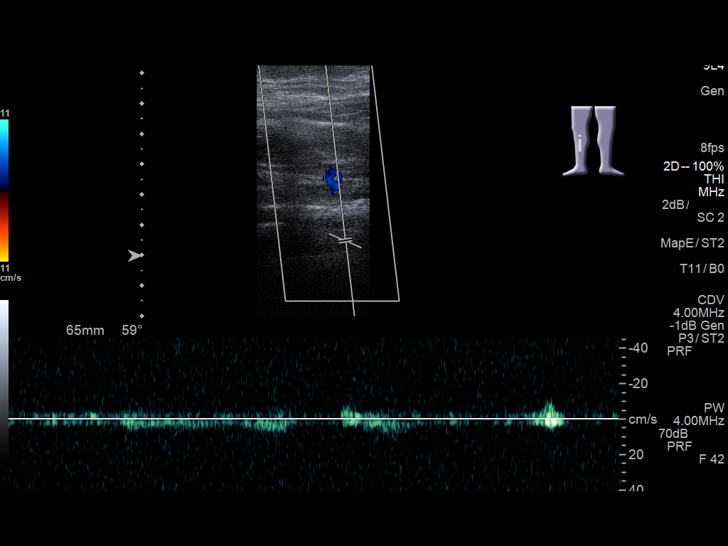

[13 of 24 positions shown; findings below may reference images not displayed]

FINDINGS: Contralateral Common Femoral Vein: Respiratory phasicity is normal
and symmetric with the symptomatic side. No evidence of thrombus.
Normal compressibility.

Common Femoral Vein: No evidence of thrombus. Normal
compressibility, respiratory phasicity and response to augmentation.

Saphenofemoral Junction: No evidence of thrombus. Normal
compressibility and flow on color Doppler imaging.

Profunda Femoral Vein: No evidence of thrombus. Normal
compressibility and flow on color Doppler imaging.

Femoral Vein: No evidence of thrombus. Normal compressibility,
respiratory phasicity and response to augmentation.

Popliteal Vein: No evidence of thrombus. Normal compressibility,
respiratory phasicity and response to augmentation.

Calf Veins: No evidence of thrombus. Normal compressibility and flow
on color Doppler imaging.

Superficial Great Saphenous Vein: No evidence of thrombus. Normal
compressibility and flow on color Doppler imaging.

Venous Reflux:  None.

Other Findings:  None.
IMPRESSION: No evidence of DVT within the right lower extremity.

## 2017-11-18 ENCOUNTER — Ambulatory Visit (INDEPENDENT_AMBULATORY_CARE_PROVIDER_SITE_OTHER): Payer: BLUE CROSS/BLUE SHIELD

## 2017-11-18 DIAGNOSIS — Z7901 Long term (current) use of anticoagulants: Secondary | ICD-10-CM | POA: Diagnosis not present

## 2017-11-18 DIAGNOSIS — I8290 Acute embolism and thrombosis of unspecified vein: Secondary | ICD-10-CM | POA: Diagnosis not present

## 2017-11-18 DIAGNOSIS — Z5181 Encounter for therapeutic drug level monitoring: Secondary | ICD-10-CM | POA: Diagnosis not present

## 2017-11-18 LAB — POCT INR: INR: 4.6 — AB (ref 2.0–3.0)

## 2017-11-18 NOTE — Patient Instructions (Signed)
Please skip coumadin tonight, take 1/2 tablet tomorrow, then resume dosage of 10 mg every day except 5 mg on Mondays & Fridays. Recheck in 2 weeks.

## 2017-12-02 ENCOUNTER — Ambulatory Visit (INDEPENDENT_AMBULATORY_CARE_PROVIDER_SITE_OTHER): Payer: BLUE CROSS/BLUE SHIELD

## 2017-12-02 DIAGNOSIS — I8290 Acute embolism and thrombosis of unspecified vein: Secondary | ICD-10-CM | POA: Diagnosis not present

## 2017-12-02 DIAGNOSIS — Z7901 Long term (current) use of anticoagulants: Secondary | ICD-10-CM

## 2017-12-02 DIAGNOSIS — Z5181 Encounter for therapeutic drug level monitoring: Secondary | ICD-10-CM

## 2017-12-02 LAB — POCT INR: INR: 3.9 — AB (ref 2.0–3.0)

## 2017-12-02 NOTE — Patient Instructions (Signed)
Please skip coumadin tonight, then resume dosage of 10 mg every day except 5 mg on Mondays & Fridays. Have a serving of greens today.  Recheck in 2 weeks.

## 2017-12-16 ENCOUNTER — Ambulatory Visit (INDEPENDENT_AMBULATORY_CARE_PROVIDER_SITE_OTHER): Payer: BLUE CROSS/BLUE SHIELD

## 2017-12-16 DIAGNOSIS — Z7901 Long term (current) use of anticoagulants: Secondary | ICD-10-CM

## 2017-12-16 DIAGNOSIS — Z5181 Encounter for therapeutic drug level monitoring: Secondary | ICD-10-CM | POA: Diagnosis not present

## 2017-12-16 DIAGNOSIS — I8290 Acute embolism and thrombosis of unspecified vein: Secondary | ICD-10-CM

## 2017-12-16 LAB — POCT INR: INR: 4.2 — AB (ref 2.0–3.0)

## 2017-12-16 NOTE — Patient Instructions (Signed)
Please skip coumadin tonight & tomorrow, then START NEW DOSAGE of 10 mg every day except 5 mg on MONDAYS, WEDNESDAYS & FRIDAYS. Recheck in 2 weeks.

## 2017-12-30 ENCOUNTER — Ambulatory Visit (INDEPENDENT_AMBULATORY_CARE_PROVIDER_SITE_OTHER): Payer: BLUE CROSS/BLUE SHIELD

## 2017-12-30 DIAGNOSIS — Z5181 Encounter for therapeutic drug level monitoring: Secondary | ICD-10-CM | POA: Diagnosis not present

## 2017-12-30 DIAGNOSIS — Z7901 Long term (current) use of anticoagulants: Secondary | ICD-10-CM

## 2017-12-30 DIAGNOSIS — I8290 Acute embolism and thrombosis of unspecified vein: Secondary | ICD-10-CM | POA: Diagnosis not present

## 2017-12-30 LAB — POCT INR: INR: 1.4 — AB (ref 2.0–3.0)

## 2017-12-30 NOTE — Patient Instructions (Signed)
Please take extra 1/2 tablet today & tomorrow, then resume of 10 mg every day except 5 mg on MONDAYS, WEDNESDAYS & FRIDAYS. Recheck in 2 weeks.

## 2018-01-13 ENCOUNTER — Ambulatory Visit (INDEPENDENT_AMBULATORY_CARE_PROVIDER_SITE_OTHER): Payer: BLUE CROSS/BLUE SHIELD

## 2018-01-13 DIAGNOSIS — Z7901 Long term (current) use of anticoagulants: Secondary | ICD-10-CM

## 2018-01-13 DIAGNOSIS — Z5181 Encounter for therapeutic drug level monitoring: Secondary | ICD-10-CM

## 2018-01-13 DIAGNOSIS — I8290 Acute embolism and thrombosis of unspecified vein: Secondary | ICD-10-CM | POA: Diagnosis not present

## 2018-01-13 LAB — POCT INR: INR: 3.5 — AB (ref 2.0–3.0)

## 2018-01-13 NOTE — Patient Instructions (Signed)
Please skip coumadin tonight, then resume of 10 mg every day except 5 mg on MONDAYS, WEDNESDAYS & FRIDAYS. Recheck in 3 weeks.

## 2018-02-03 ENCOUNTER — Ambulatory Visit (INDEPENDENT_AMBULATORY_CARE_PROVIDER_SITE_OTHER): Payer: BLUE CROSS/BLUE SHIELD

## 2018-02-03 DIAGNOSIS — Z7901 Long term (current) use of anticoagulants: Secondary | ICD-10-CM | POA: Diagnosis not present

## 2018-02-03 DIAGNOSIS — I8290 Acute embolism and thrombosis of unspecified vein: Secondary | ICD-10-CM | POA: Diagnosis not present

## 2018-02-03 DIAGNOSIS — Z5181 Encounter for therapeutic drug level monitoring: Secondary | ICD-10-CM

## 2018-02-03 LAB — POCT INR: INR: 1.5 — AB (ref 2.0–3.0)

## 2018-02-03 NOTE — Patient Instructions (Signed)
Please take 2 tablets tonight,, 1.5 tablets tomorrow, then resume of 10 mg every day except 5 mg on MONDAYS, WEDNESDAYS & FRIDAYS. Recheck in 2 weeks.

## 2018-02-15 ENCOUNTER — Ambulatory Visit (INDEPENDENT_AMBULATORY_CARE_PROVIDER_SITE_OTHER): Payer: BLUE CROSS/BLUE SHIELD

## 2018-02-15 DIAGNOSIS — Z7901 Long term (current) use of anticoagulants: Secondary | ICD-10-CM

## 2018-02-15 DIAGNOSIS — Z5181 Encounter for therapeutic drug level monitoring: Secondary | ICD-10-CM

## 2018-02-15 DIAGNOSIS — I8290 Acute embolism and thrombosis of unspecified vein: Secondary | ICD-10-CM

## 2018-02-15 LAB — POCT INR: INR: 2.4 (ref 2.0–3.0)

## 2018-02-15 NOTE — Patient Instructions (Signed)
Please continue dosage of 10 mg every day except 5 mg on MONDAYS, WEDNESDAYS & FRIDAYS. Recheck in 3 weeks.

## 2018-03-03 ENCOUNTER — Ambulatory Visit (INDEPENDENT_AMBULATORY_CARE_PROVIDER_SITE_OTHER): Payer: BLUE CROSS/BLUE SHIELD

## 2018-03-03 DIAGNOSIS — Z5181 Encounter for therapeutic drug level monitoring: Secondary | ICD-10-CM

## 2018-03-03 DIAGNOSIS — Z7901 Long term (current) use of anticoagulants: Secondary | ICD-10-CM

## 2018-03-03 DIAGNOSIS — I8290 Acute embolism and thrombosis of unspecified vein: Secondary | ICD-10-CM

## 2018-03-03 LAB — POCT INR: INR: 3.1 — AB (ref 2.0–3.0)

## 2018-03-03 NOTE — Patient Instructions (Signed)
Please have a large serving of greens today and continue dosage of 10 mg every day except 5 mg on MONDAYS, WEDNESDAYS & FRIDAYS. Recheck in 4 weeks.

## 2018-03-31 ENCOUNTER — Ambulatory Visit: Payer: BLUE CROSS/BLUE SHIELD

## 2018-03-31 DIAGNOSIS — Z7901 Long term (current) use of anticoagulants: Secondary | ICD-10-CM | POA: Diagnosis not present

## 2018-03-31 DIAGNOSIS — I8290 Acute embolism and thrombosis of unspecified vein: Secondary | ICD-10-CM

## 2018-03-31 DIAGNOSIS — Z5181 Encounter for therapeutic drug level monitoring: Secondary | ICD-10-CM | POA: Diagnosis not present

## 2018-03-31 LAB — POCT INR: INR: 3.7 — AB (ref 2.0–3.0)

## 2018-03-31 NOTE — Patient Instructions (Signed)
Please skip coumadin tonight, 1/2 tablet tomorrow, then continue dosage of 10 mg every day except 5 mg on MONDAYS, WEDNESDAYS & FRIDAYS. Recheck in 3.5 weeks.

## 2018-04-26 ENCOUNTER — Ambulatory Visit: Payer: BLUE CROSS/BLUE SHIELD

## 2018-04-26 DIAGNOSIS — Z7901 Long term (current) use of anticoagulants: Secondary | ICD-10-CM | POA: Diagnosis not present

## 2018-04-26 DIAGNOSIS — I8290 Acute embolism and thrombosis of unspecified vein: Secondary | ICD-10-CM

## 2018-04-26 DIAGNOSIS — Z5181 Encounter for therapeutic drug level monitoring: Secondary | ICD-10-CM | POA: Diagnosis not present

## 2018-04-26 LAB — POCT INR: INR: 3.5 — AB (ref 2.0–3.0)

## 2018-04-26 NOTE — Patient Instructions (Signed)
Please skip coumadin tonight, THEN START NEW DOSAGE of 5 mg every day except 10 mg on MONDAYS, WEDNESDAYS & FRIDAYS. Recheck in 3 weeks.

## 2018-05-17 ENCOUNTER — Other Ambulatory Visit: Payer: Self-pay

## 2018-05-17 ENCOUNTER — Ambulatory Visit (INDEPENDENT_AMBULATORY_CARE_PROVIDER_SITE_OTHER): Payer: BLUE CROSS/BLUE SHIELD

## 2018-05-17 DIAGNOSIS — Z7901 Long term (current) use of anticoagulants: Secondary | ICD-10-CM

## 2018-05-17 DIAGNOSIS — I8290 Acute embolism and thrombosis of unspecified vein: Secondary | ICD-10-CM | POA: Diagnosis not present

## 2018-05-17 DIAGNOSIS — Z5181 Encounter for therapeutic drug level monitoring: Secondary | ICD-10-CM

## 2018-05-17 LAB — POCT INR: INR: 3.2 — AB (ref 2.0–3.0)

## 2018-05-17 NOTE — Patient Instructions (Signed)
Please take 1/2 tablet tonight, then continue dosage of 5 mg every day except 10 mg on MONDAYS, WEDNESDAYS & FRIDAYS. Recheck in 5 weeks.

## 2018-06-18 ENCOUNTER — Telehealth: Payer: Self-pay

## 2018-06-18 NOTE — Telephone Encounter (Signed)
Attempted to contact pt to prescreen for INR check on Monday, April 27 and let pt know that I will be in front of [employer] for drive thru @ 8:61. No answer, no vm, though I did speak w/ pt's mother regarding her own appt on that same day.

## 2018-06-21 ENCOUNTER — Ambulatory Visit (INDEPENDENT_AMBULATORY_CARE_PROVIDER_SITE_OTHER): Payer: BLUE CROSS/BLUE SHIELD

## 2018-06-21 ENCOUNTER — Other Ambulatory Visit: Payer: Self-pay

## 2018-06-21 DIAGNOSIS — I8290 Acute embolism and thrombosis of unspecified vein: Secondary | ICD-10-CM | POA: Diagnosis not present

## 2018-06-21 DIAGNOSIS — Z5181 Encounter for therapeutic drug level monitoring: Secondary | ICD-10-CM | POA: Diagnosis not present

## 2018-06-21 DIAGNOSIS — Z7901 Long term (current) use of anticoagulants: Secondary | ICD-10-CM

## 2018-06-21 LAB — POCT INR: INR: 2.7 (ref 2.0–3.0)

## 2018-06-21 NOTE — Patient Instructions (Signed)
Please continue dosage of 5 mg every day except 10 mg on MONDAYS, WEDNESDAYS & FRIDAYS. Recheck in 7 weeks.

## 2018-07-28 ENCOUNTER — Telehealth: Payer: Self-pay

## 2018-07-28 NOTE — Telephone Encounter (Signed)
Virtual Visit Pre-Appointment Phone Call  1. Confirm consent - "In the setting of the current Covid19 crisis, you are scheduled for a (phone or video) visit with your provider on (date) at (time).  Just as we do with many in-office visits, in order for you to participate in this visit, we must obtain consent.  If you'd like, I can send this to your mychart (if signed up) or email for you to review.  Otherwise, I can obtain your verbal consent now.  All virtual visits are billed to your insurance company just like a normal visit would be.  By agreeing to a virtual visit, we'd like you to understand that the technology does not allow for your provider to perform an examination, and thus may limit your provider's ability to fully assess your condition. If your provider identifies any concerns that need to be evaluated in person, we will make arrangements to do so.  Finally, though the technology is pretty good, we cannot assure that it will always work on either your or our end, and in the setting of a video visit, we may have to convert it to a phone-only visit.  In either situation, we cannot ensure that we have a secure connection.  Are you willing to proceed?" YES  2. Advise patient to be prepared - "Two hours prior to your appointment, go ahead and check your blood pressure, pulse, oxygen saturation, and your weight (if you have the equipment to check those) and write them all down. When your visit starts, your provider will ask you for this information. If you have an Apple Watch or Kardia device, please plan to have heart rate information ready on the day of your appointment. Please have a pen and paper handy nearby the day of the visit as well."  3. Give patient instructions for MyChart download to smartphone OR [email or web address] as below if video visit (depending on what platform provider is using)  4. Inform patient they will receive a phone call 15 minutes prior to their appointment time (may be  from unknown caller ID) so they should be prepared to answer    TELEPHONE CALL NOTE  Angel Campbell has been deemed a candidate for a follow-up tele-health visit to limit community exposure during the Covid-19 pandemic. I spoke with the patient via phone to ensure availability of phone/video source, confirm preferred email & phone number, and discuss instructions and expectations.  I reminded Angel Campbell to be prepared with any vital sign and/or heart rhythm information that could potentially be obtained via home monitoring, at the time of her visit. I reminded Angel Campbell to expect a phone call prior to her visit.  Aurelio JewKathy M , RMA 07/28/2018 4:54 PM        FULL LENGTH CONSENT FOR TELE-HEALTH VISIT   I hereby voluntarily request, consent and authorize [facility] and its employed or contracted physicians, physician assistants, nurse practitioners or other licensed health care professionals (the Practitioner), to provide me with telemedicine health care services (the "Services") as deemed necessary by the treating Practitioner. I acknowledge and consent to receive the Services by the Practitioner via telemedicine. I understand that the telemedicine visit will involve communicating with the Practitioner through live audiovisual communication technology and the disclosure of certain medical information by electronic transmission. I acknowledge that I have been given the opportunity to request an in-person assessment or other available alternative prior to the telemedicine visit and am voluntarily participating in the telemedicine visit.  I  understand that I have the right to withhold or withdraw my consent to the use of telemedicine in the course of my care at any time, without affecting my right to future care or treatment, and that the Practitioner or I may terminate the telemedicine visit at any time. I understand that I have the right to inspect all information obtained and/or  recorded in the course of the telemedicine visit and may receive copies of available information for a reasonable fee.  I understand that some of the potential risks of receiving the Services via telemedicine include:  Marland Kitchen Delay or interruption in medical evaluation due to technological equipment failure or disruption; . Information transmitted may not be sufficient (e.g. poor resolution of images) to allow for appropriate medical decision making by the Practitioner; and/or  . In rare instances, security protocols could fail, causing a breach of personal health information.  Furthermore, I acknowledge that it is my responsibility to provide information about my medical history, conditions and care that is complete and accurate to the best of my ability. I acknowledge that Practitioner's advice, recommendations, and/or decision may be based on factors not within their control, such as incomplete or inaccurate data provided by me or distortions of diagnostic images or specimens that may result from electronic transmissions. I understand that the practice of medicine is not an exact science and that Practitioner makes no warranties or guarantees regarding treatment outcomes. I acknowledge that I will receive a copy of this consent concurrently upon execution via email to the email address I last provided but may also request a printed copy by calling the office of [facility].    I understand that my insurance will be billed for this visit.   I have read or had this consent read to me. . I understand the contents of this consent, which adequately explains the benefits and risks of the Services being provided via telemedicine.  . I have been provided ample opportunity to ask questions regarding this consent and the Services and have had my questions answered to my satisfaction. . I give my informed consent for the services to be provided through the use of telemedicine in my medical care  By participating  in this telemedicine visit I agree to the above.

## 2018-07-29 ENCOUNTER — Telehealth (INDEPENDENT_AMBULATORY_CARE_PROVIDER_SITE_OTHER): Payer: BC Managed Care – PPO | Admitting: Nurse Practitioner

## 2018-07-29 ENCOUNTER — Encounter: Payer: Self-pay | Admitting: Nurse Practitioner

## 2018-07-29 ENCOUNTER — Other Ambulatory Visit: Payer: Self-pay

## 2018-07-29 DIAGNOSIS — Z86718 Personal history of other venous thrombosis and embolism: Secondary | ICD-10-CM | POA: Diagnosis not present

## 2018-07-29 NOTE — Patient Instructions (Signed)
It was a pleasure to speak with you on the phone today! Thank you for allowing [place] to continue taking care of your [facility] needs during this time.   Feel free to call as needed for questions and concerns related to your cardiac needs.   Medication Instructions:  Your physician recommends that you continue on your current medications as directed. Please refer to the Current Medication list given to you today.  If you need a refill on your cardiac medications before your next appointment, please call your pharmacy.   Lab work: None ordered  If you have labs (blood work) drawn today and your tests are completely normal, you will receive your results only by: Marland Kitchen MyChart Message (if you have MyChart) OR . A paper copy in the mail If you have any lab test that is abnormal or we need to change your treatment, we will call you to review the results.  Testing/Procedures: None ordered   Follow-Up: At [facility] you and your health needs are our priority.  As part of our continuing mission to provide you with exceptional heart care, we have created designated Provider Care Teams.  These Care Teams include your primary Cardiologist (physician) and Advanced Practice Providers (APPs -  Physician Assistants and Nurse Practitioners) who all work together to provide you with the care you need, when you need it. You will need a follow up appointment in 1 years.  Please call our office 2 months in advance to schedule this appointment.  You may see Dr. Kirke Corin or Nicolasa Ducking, NP.

## 2018-07-29 NOTE — Progress Notes (Signed)
Virtual Visit via Telephone Note   This visit type was conducted due to national recommendations for restrictions regarding the COVID-19 Pandemic (e.g. social distancing) in an effort to limit this patient's exposure and mitigate transmission in our community.  Due to her co-morbid illnesses, this patient is at least at moderate risk for complications without adequate follow up.  This format is felt to be most appropriate for this patient at this time.  The patient did not have access to video technology/had technical difficulties with video requiring transitioning to audio format only (telephone).  All issues noted in this document were discussed and addressed.  No physical exam could be performed with this format.  Please refer to the patient's chart for her  consent to telehealth for [facility]. Evaluation Performed:  Follow-up visit  This visit type was conducted due to national recommendations for restrictions regarding the COVID-19 Pandemic (e.g. social distancing).  This format is felt to be most appropriate for this patient at this time.  All issues noted in this document were discussed and addressed.  No physical exam was performed (except for noted visual exam findings with Video Visits).  Please refer to the patient's chart (MyChart message for video visits and phone note for telephone visits) for the patient's consent to telehealth for [facility]. _____________   Date:  07/29/2018   Patient ID:  Canon Oriordan, DOB 1970/10/08, MRN [id number] Patient Location:  Home Provider location:   Office  Primary Care Provider:  Sharilyn Sites, MD Primary Cardiologist:  No primary care provider on file.  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  Past Medical History    Past Medical History:  Diagnosis Date  . COPD (chronic  obstructive pulmonary disease) (HCC)   . DVT of deep femoral vein (HCC) 2004   a. Chronic coumadin.  . Pulmonary embolism (HCC)   . Vertigo    Past Surgical History:  Procedure Laterality Date  . arm surgery    . CESAREAN SECTION    . CHOLECYSTECTOMY    . TOE SURGERY  May 2015    Allergies  Allergies  Allergen Reactions  . Sodium Hypochlorite   . Tape     History of Present Illness    Saranya Cator is a 48 y.o. female who presents via audio/video conferencing for a telehealth visit today.  As above, she has a prior history of extensive limb threatening DVT in 2005 complicated by pulmonary embolism.  She has been on warfarin therapy ever since then and is followed closely in our Coumadin clinic.  She was last seen in [facility] clinic in  December 2018.  Since her last visit, she has done well from a cardiac standpoint.  She continues to work at [employer] in [employer] and has a relatively active job.  She does not experience chest pain or dyspnea.  She has noted intermittent abdominal cramping and tenderness, and also sometimes notes reflux type symptoms when she is overeating.  She plans to follow-up with primary care regarding the symptoms.  She is not currently taking antacid and can avoid GERD symptoms by reducing the amount she is eating.  She denies palpitations, PND, orthopnea, dizziness, syncope, edema, early satiety, melena, or bright red blood per rectum.  She has been appropriately socially distancing and wearing a mask in public.  The patient does not have symptoms  concerning for COVID-19 infection (fever, chills, cough, or new shortness of breath).   Home Medications    Prior to Admission medications   Medication Sig Start Date End Date Taking? Authorizing Provider  traMADol (ULTRAM) 50 MG tablet Take 1 tablet (50 mg total) by mouth every 6 (six) hours as needed. 03/16/17   Rebecka ApleyWebster, Allison P, MD  warfarin (COUMADIN) 10 MG tablet Take 1 tablet (10 mg total) by mouth as  directed. Take as directed by the coumadin clinic 09/30/17   End, Cristal Deerhristopher, MD    Review of Systems    Intermittent abdominal cramping and tenderness.  She denies chest pain, palpitations, dyspnea, PND, orthopnea, dizziness, syncope, edema, or early satiety.  All other systems reviewed and are otherwise negative except as noted above.  Physical Exam    Vital Signs:  There were no vitals taken for this visit.   Visit carried out over the phone.  Pleasant, no acute distress, awake alert and oriented x3.  Speech unlabored.  Accessory Clinical Findings    Lab Results  Component Value Date   INR 2.7 06/21/2018   INR 3.2 (A) 05/17/2018   INR 3.5 (A) 04/26/2018   PROTIME 10.8 01/17/2014   Assessment & Plan    1.  History of DVT and PE: On chronic Coumadin and followed closely in our Coumadin clinic.  INR was therapeutic in April and she is due for follow-up in 2 weeks.  2.  Abdominal cramping/GERD: Patient to follow-up with primary care.  Abdominal cramping has been intermittent over the past several months and is sometimes followed by left upper quadrant tenderness.  She also notes GERD-like symptoms if overeating.  As she can limit those symptoms by reducing the amount that she is eating at one setting, she is not interested in antacid therapy at this time.  3.  Morbid obesity: She does have a gym membership now but is not able to use the gym as it is closed in the setting of COVID-19.  She recognizes that she needs to be more active.  4.  Disposition: Follow-up in Coumadin clinic as scheduled.  Follow-up with Dr. Kirke CorinArida in 1 year or sooner if necessary.  COVID-19 Education: The signs and symptoms of COVID-19 were discussed with the patient and how to seek care for testing (follow up with PCP or arrange E-visit).  The importance of social distancing was discussed today.  Patient Risk:   After full review of this patient's history and clinical status, I feel that he is at least moderate  risk for cardiac complications at this time, thus necessitating a telehealth visit sooner than our first available in office visit.  Time:   Today, I have spent 13 minutes with the patient with telehealth technology discussing medical history, symptoms, and management plan.     Nicolasa Duckinghristopher Keiffer Piper, NP 07/29/2018, 9:36 AM

## 2018-07-30 ENCOUNTER — Other Ambulatory Visit: Payer: Self-pay

## 2018-08-09 ENCOUNTER — Telehealth: Payer: Self-pay

## 2018-08-09 NOTE — Telephone Encounter (Signed)

## 2018-08-11 ENCOUNTER — Ambulatory Visit (INDEPENDENT_AMBULATORY_CARE_PROVIDER_SITE_OTHER): Payer: BC Managed Care – PPO

## 2018-08-11 ENCOUNTER — Other Ambulatory Visit: Payer: Self-pay

## 2018-08-11 DIAGNOSIS — Z7901 Long term (current) use of anticoagulants: Secondary | ICD-10-CM

## 2018-08-11 DIAGNOSIS — I8290 Acute embolism and thrombosis of unspecified vein: Secondary | ICD-10-CM

## 2018-08-11 DIAGNOSIS — Z5181 Encounter for therapeutic drug level monitoring: Secondary | ICD-10-CM

## 2018-08-11 LAB — POCT INR: INR: 1.3 — AB (ref 2.0–3.0)

## 2018-08-11 NOTE — Patient Instructions (Signed)
Please take 2 tablets tonight, 1.5 tomorrow, then continue dosage of 5 mg every day except 10 mg on MONDAYS, [place]. Recheck in 4 weeks.

## 2018-09-06 ENCOUNTER — Telehealth: Payer: Self-pay

## 2018-09-06 NOTE — Telephone Encounter (Signed)
Attempted to contact pt to prescreen for COVID19 prior to INR appt on Wed, 7/15. Advised pt of appt time and that she will need to enter thru the [facility] entrance of [facility] and make sure she has her mask. Asked her to call back w/ any questions or concerns.  

## 2018-09-08 ENCOUNTER — Other Ambulatory Visit: Payer: Self-pay

## 2018-09-08 ENCOUNTER — Ambulatory Visit (INDEPENDENT_AMBULATORY_CARE_PROVIDER_SITE_OTHER): Payer: BC Managed Care – PPO

## 2018-09-08 DIAGNOSIS — Z5181 Encounter for therapeutic drug level monitoring: Secondary | ICD-10-CM

## 2018-09-08 DIAGNOSIS — I8290 Acute embolism and thrombosis of unspecified vein: Secondary | ICD-10-CM

## 2018-09-08 DIAGNOSIS — Z7901 Long term (current) use of anticoagulants: Secondary | ICD-10-CM

## 2018-09-08 LAB — POCT INR: INR: 1.6 — AB (ref 2.0–3.0)

## 2018-09-08 NOTE — Patient Instructions (Signed)
Please take 2 tablets tonight, 1 tablet tomorrow, then continue dosage of 5 mg every day except 10 mg on MONDAYS, [place]. Recheck in 3 weeks.

## 2018-09-27 ENCOUNTER — Telehealth: Payer: Self-pay

## 2018-09-27 NOTE — Telephone Encounter (Signed)
Attempted to contact pt to prescreen for COVID19 prior to appt for INR check on Wed, 8/5. Left message on vm w/ appt time, asked her to call back if she will be unable to keep this appt.  

## 2018-09-29 ENCOUNTER — Ambulatory Visit (INDEPENDENT_AMBULATORY_CARE_PROVIDER_SITE_OTHER): Payer: BC Managed Care – PPO

## 2018-09-29 ENCOUNTER — Other Ambulatory Visit: Payer: Self-pay

## 2018-09-29 DIAGNOSIS — Z7901 Long term (current) use of anticoagulants: Secondary | ICD-10-CM | POA: Diagnosis not present

## 2018-09-29 DIAGNOSIS — Z5181 Encounter for therapeutic drug level monitoring: Secondary | ICD-10-CM

## 2018-09-29 DIAGNOSIS — I8290 Acute embolism and thrombosis of unspecified vein: Secondary | ICD-10-CM | POA: Diagnosis not present

## 2018-09-29 LAB — POCT INR: INR: 2 (ref 2.0–3.0)

## 2018-09-29 NOTE — Patient Instructions (Signed)
Please continue dosage of 5 mg every day except 10 mg on MONDAYS, WEDNESDAYS & FRIDAYS. Recheck in 4 weeks. 

## 2018-10-19 ENCOUNTER — Telehealth: Payer: Self-pay

## 2018-10-19 ENCOUNTER — Other Ambulatory Visit: Payer: Self-pay | Admitting: Internal Medicine

## 2018-10-19 MED ORDER — WARFARIN SODIUM 10 MG PO TABS: ORAL_TABLET | ORAL | 0 refills | Status: DC

## 2018-10-19 NOTE — Telephone Encounter (Signed)
Please review for refill on [place]. Thanks 

## 2018-10-19 NOTE — Telephone Encounter (Signed)
Refill Request.  

## 2018-10-27 ENCOUNTER — Other Ambulatory Visit: Payer: Self-pay

## 2018-10-27 ENCOUNTER — Ambulatory Visit (INDEPENDENT_AMBULATORY_CARE_PROVIDER_SITE_OTHER): Payer: BC Managed Care – PPO

## 2018-10-27 DIAGNOSIS — Z5181 Encounter for therapeutic drug level monitoring: Secondary | ICD-10-CM | POA: Diagnosis not present

## 2018-10-27 DIAGNOSIS — Z7901 Long term (current) use of anticoagulants: Secondary | ICD-10-CM | POA: Diagnosis not present

## 2018-10-27 DIAGNOSIS — I8290 Acute embolism and thrombosis of unspecified vein: Secondary | ICD-10-CM

## 2018-10-27 LAB — POCT INR: INR: 0.9 — AB (ref 2.0–3.0)

## 2018-10-27 NOTE — Patient Instructions (Signed)
Please take 2 tablets tonight and tomorrow, then continue dosage of 5 mg every day except 10 mg on MONDAYS, [place]. Recheck in 2 weeks.

## 2018-11-10 ENCOUNTER — Other Ambulatory Visit: Payer: Self-pay

## 2018-11-10 ENCOUNTER — Ambulatory Visit (INDEPENDENT_AMBULATORY_CARE_PROVIDER_SITE_OTHER): Payer: BC Managed Care – PPO

## 2018-11-10 DIAGNOSIS — Z7901 Long term (current) use of anticoagulants: Secondary | ICD-10-CM | POA: Diagnosis not present

## 2018-11-10 DIAGNOSIS — I8290 Acute embolism and thrombosis of unspecified vein: Secondary | ICD-10-CM

## 2018-11-10 DIAGNOSIS — Z5181 Encounter for therapeutic drug level monitoring: Secondary | ICD-10-CM | POA: Diagnosis not present

## 2018-11-10 LAB — POCT INR: INR: 2.3 (ref 2.0–3.0)

## 2018-11-10 NOTE — Patient Instructions (Signed)
Please continue dosage of 5 mg every day except 10 mg on MONDAYS, [place]. Recheck in 3 weeks.

## 2018-11-24 ENCOUNTER — Ambulatory Visit (INDEPENDENT_AMBULATORY_CARE_PROVIDER_SITE_OTHER): Payer: BC Managed Care – PPO

## 2018-11-24 ENCOUNTER — Other Ambulatory Visit: Payer: Self-pay

## 2018-11-24 DIAGNOSIS — I8290 Acute embolism and thrombosis of unspecified vein: Secondary | ICD-10-CM

## 2018-11-24 DIAGNOSIS — Z7901 Long term (current) use of anticoagulants: Secondary | ICD-10-CM | POA: Diagnosis not present

## 2018-11-24 DIAGNOSIS — Z5181 Encounter for therapeutic drug level monitoring: Secondary | ICD-10-CM | POA: Diagnosis not present

## 2018-11-24 LAB — POCT INR: INR: 1.6 — AB (ref 2.0–3.0)

## 2018-11-24 NOTE — Patient Instructions (Signed)
Please take 2 tablets today, then continue dosage of 5 mg every day except 10 mg on MONDAYS, [place]. Recheck in 12 days.

## 2018-12-06 ENCOUNTER — Ambulatory Visit (INDEPENDENT_AMBULATORY_CARE_PROVIDER_SITE_OTHER): Payer: BC Managed Care – PPO

## 2018-12-06 ENCOUNTER — Other Ambulatory Visit: Payer: Self-pay

## 2018-12-06 DIAGNOSIS — Z7901 Long term (current) use of anticoagulants: Secondary | ICD-10-CM | POA: Diagnosis not present

## 2018-12-06 DIAGNOSIS — Z5181 Encounter for therapeutic drug level monitoring: Secondary | ICD-10-CM

## 2018-12-06 DIAGNOSIS — I8290 Acute embolism and thrombosis of unspecified vein: Secondary | ICD-10-CM

## 2018-12-06 LAB — POCT INR: INR: 3.6 — AB (ref 2.0–3.0)

## 2018-12-06 NOTE — Patient Instructions (Signed)
Please skip warfarin today, then continue dosage of 5 mg every day except 10 mg on MONDAYS, [place]. Recheck in 2 weeks.

## 2018-12-10 ENCOUNTER — Emergency Department
Admission: EM | Admit: 2018-12-10 | Discharge: 2018-12-10 | Disposition: A | Discharge status: Home / Self Care | Payer: BC Managed Care – PPO | Attending: Emergency Medicine | Admitting: Emergency Medicine

## 2018-12-10 ENCOUNTER — Encounter: Payer: Self-pay | Admitting: Emergency Medicine

## 2018-12-10 ENCOUNTER — Other Ambulatory Visit: Payer: Self-pay

## 2018-12-10 DIAGNOSIS — H60311 Diffuse otitis externa, right ear: Secondary | ICD-10-CM | POA: Diagnosis not present

## 2018-12-10 DIAGNOSIS — Z87891 Personal history of nicotine dependence: Secondary | ICD-10-CM | POA: Diagnosis not present

## 2018-12-10 DIAGNOSIS — J449 Chronic obstructive pulmonary disease, unspecified: Secondary | ICD-10-CM | POA: Insufficient documentation

## 2018-12-10 DIAGNOSIS — Z7901 Long term (current) use of anticoagulants: Secondary | ICD-10-CM | POA: Insufficient documentation

## 2018-12-10 DIAGNOSIS — H9201 Otalgia, right ear: Secondary | ICD-10-CM | POA: Diagnosis present

## 2018-12-10 MED ORDER — NEOMYCIN-POLYMYXIN-HC 3.5-10000-1 OT SOLN: 3.0000 [drp] | Freq: Three times a day (TID) | OTIC | 0 refills | Status: AC

## 2018-12-10 MED ORDER — NEOMYCIN-POLYMYXIN-HC 3.5-10000-1 OT SOLN: 3.0000 [drp] | Freq: Three times a day (TID) | OTIC | 0 refills | Status: DC

## 2018-12-10 NOTE — ED Triage Notes (Signed)
Here for right ear pain.  Had pain prior to sticking Qtip in ear but now pain worse and ear bleeding.  Is on coumadin, last INR 3.6.  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

## 2018-12-10 NOTE — ED Provider Notes (Signed)
[facility] Emergency Department Provider Note   ____________________________________________   First MD Initiated Contact with Patient 12/10/18 0720     (approximate)  I have reviewed the triage vital signs and the nursing notes.   HISTORY  Chief Complaint Otalgia    HPI Angel Campbell is a 48 y.o. female patient complain of right ear pain.  Patient state last night she was cleaning her ear with a Q-tip but awakened this morning with pain and slight bleeding from the ear.  Patient is on Coumadin.  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  Patient rates the pain as a 6/10.  Patient described the pain as "achy".  No positive measure for complaint.         Past Medical History:  Diagnosis Date  . COPD (chronic obstructive pulmonary disease) ([place])   . DVT of deep femoral vein ([place]) 2004   a. Chronic coumadin.  . Pulmonary embolism ([place])   . Vertigo     Patient Active Problem List   Diagnosis Date Noted  . Encounter for monitoring Coumadin therapy 02/10/2017  . DVT, bilateral lower limbs ([place]) 06/07/2015  . Pulmonary emboli ([place]) 06/07/2015  . Thyroid nodule 06/07/2015    Past Surgical History:  Procedure Laterality Date  . arm surgery    . CESAREAN SECTION    . CHOLECYSTECTOMY    . TOE SURGERY  May 2015    Prior to Admission medications   Medication Sig Start Date End Date Taking? Authorizing Provider  neomycin-polymyxin-hydrocortisone (CORTISPORIN) OTIC solution Place 3 drops into the right ear 3 (three) times daily for 10 days. 12/10/18 12/20/18  Sable Feil, PA-C  warfarin (COUMADIN) 10 MG tablet Take 1/2 tablet daily except 1 tablet Monday, Wednesday, Friday or as directed by Coumadin Clinic. 10/19/18   Theora Gianotti, NP    Allergies Sodium hypochlorite and Tape  Family History  Problem Relation Age of Onset  . Diabetes Mother   . Stroke Mother   . Arrhythmia Mother   . AAA (abdominal aortic  aneurysm) Father     Social History Social History   Tobacco Use  . Smoking status: Former Research scientist (life sciences)  . Smokeless tobacco: Never Used  Substance Use Topics  . Alcohol use: No  . Drug use: No    Review of Systems Constitutional: No fever/chills Eyes: No visual changes. ENT: No sore throat. Cardiovascular: Denies chest pain.  DVT Respiratory: Denies shortness of breath.  COPD. Gastrointestinal: No abdominal pain.  No nausea, no vomiting.  No diarrhea.  No constipation. Genitourinary: Negative for dysuria. Musculoskeletal: Negative for back pain. Skin: Negative for rash. Neurological: Negative for headaches, focal weakness or numbness. Allergic/Immunilogical: Sodium hypochlorite and tape. ____________________________________________   PHYSICAL EXAM:  VITAL SIGNS: ED Triage Vitals  Enc Vitals Group     BP 12/10/18 0713 124/77     Pulse Rate 12/10/18 0713 76     Resp 12/10/18 0713 18     Temp 12/10/18 0713 98.2 F (36.8 C)     Temp Source 12/10/18 0713 Oral     SpO2 12/10/18 0713 96 %     Weight 12/10/18 0707 286 lb (129.7 kg)     Height 12/10/18 0707 5\' 9"  (1.753 m)     Head Circumference --      Peak Flow --      Pain Score 12/10/18 0707 6     Pain Loc --      Pain Edu? --  Excl. in GC? --    Constitutional: Alert and oriented. Well appearing and in no acute distress. EARS: Edematous right canal with excoriation to superior aspect of the canal.  Dried blood in canal. Nose: No congestion/rhinnorhea. Mouth/Throat: Mucous membranes are moist.  Oropharynx non-erythematous. Hematological/Lymphatic/Immunilogical: No cervical lymphadenopathy. Cardiovascular: Normal rate, regular rhythm. Grossly normal heart sounds.  Good peripheral circulation. Respiratory: Normal respiratory effort.  No retractions. Lungs CTAB. Neurologic:  Normal speech and language. No gross focal neurologic deficits are appreciated. No gait instability. Skin:  Skin is warm, dry and intact. No rash  noted. Psychiatric: Mood and affect are normal. Speech and behavior are normal.  ____________________________________________   LABS (all labs ordered are listed, but only abnormal results are displayed)  Labs Reviewed - No data to display ____________________________________________  EKG   ____________________________________________  RADIOLOGY  ED MD interpretation:    Official radiology report(s): No results found.  ____________________________________________   PROCEDURES  Procedure(s) performed (including Critical Care):  Procedures   ____________________________________________   INITIAL IMPRESSION / ASSESSMENT AND PLAN / ED COURSE  As part of my medical decision making, I reviewed the following data within the electronic MEDICAL RECORD NUMBER         Angel Campbell was evaluated in Emergency Department on 12/10/2018 for the symptoms described in the history of present illness. She was evaluated in the context of the global COVID-19 pandemic, which necessitated consideration that the patient might be at risk for infection with the SARS-CoV-2 virus that causes COVID-19. Institutional protocols and algorithms that pertain to the evaluation of patients at risk for COVID-19 are in a state of rapid change based on information released by regulatory bodies including the CDC and federal and state organizations. These policies and algorithms were followed during the patient's care in the ED.  Patient presents with right ear pain and mild bleeding secondary to excoriation from Q-tip.  Patient given discharge care instruction advised use eardrops as directed.  Patient advised follow ENT clinic if no improvement in 3 days.     ____________________________________________   FINAL CLINICAL IMPRESSION(S) / ED DIAGNOSES  Final diagnoses:  Acute diffuse otitis externa of right ear     ED Discharge Orders         Ordered    neomycin-polymyxin-hydrocortisone (CORTISPORIN)  OTIC solution  3 times daily,   Status:  Discontinued     12/10/18 0724    neomycin-polymyxin-hydrocortisone (CORTISPORIN) OTIC solution  3 times daily     12/10/18 0730           Note:  This document was prepared using Dragon voice recognition software and may include unintentional dictation errors.    Joni Reining, PA-C 12/10/18 0017    Concha Se, MD 12/10/18 [phone]

## 2018-12-12 ENCOUNTER — Emergency Department
Admission: EM | Admit: 2018-12-12 | Discharge: 2018-12-12 | Disposition: A | Discharge status: Home / Self Care | Payer: BC Managed Care – PPO | Attending: Emergency Medicine | Admitting: Emergency Medicine

## 2018-12-12 ENCOUNTER — Other Ambulatory Visit: Payer: Self-pay

## 2018-12-12 DIAGNOSIS — J069 Acute upper respiratory infection, unspecified: Secondary | ICD-10-CM | POA: Diagnosis not present

## 2018-12-12 DIAGNOSIS — Z87891 Personal history of nicotine dependence: Secondary | ICD-10-CM | POA: Diagnosis not present

## 2018-12-12 DIAGNOSIS — Z7901 Long term (current) use of anticoagulants: Secondary | ICD-10-CM | POA: Insufficient documentation

## 2018-12-12 DIAGNOSIS — Y33XXXA Other specified events, undetermined intent, initial encounter: Secondary | ICD-10-CM | POA: Diagnosis not present

## 2018-12-12 DIAGNOSIS — J449 Chronic obstructive pulmonary disease, unspecified: Secondary | ICD-10-CM | POA: Diagnosis not present

## 2018-12-12 DIAGNOSIS — Z86718 Personal history of other venous thrombosis and embolism: Secondary | ICD-10-CM | POA: Diagnosis not present

## 2018-12-12 DIAGNOSIS — Z79899 Other long term (current) drug therapy: Secondary | ICD-10-CM | POA: Insufficient documentation

## 2018-12-12 DIAGNOSIS — S00411D Abrasion of right ear, subsequent encounter: Secondary | ICD-10-CM

## 2018-12-12 DIAGNOSIS — Z86711 Personal history of pulmonary embolism: Secondary | ICD-10-CM | POA: Insufficient documentation

## 2018-12-12 DIAGNOSIS — R05 Cough: Secondary | ICD-10-CM | POA: Diagnosis present

## 2018-12-12 MED ORDER — PREDNISONE 20 MG PO TABS
60.0000 mg | ORAL_TABLET | Freq: Once | ORAL | Status: AC
  Administered 2018-12-12: 60 mg via ORAL
  Filled 2018-12-12: qty 3

## 2018-12-12 MED ORDER — ALBUTEROL SULFATE HFA 108 (90 BASE) MCG/ACT IN AERS
2.0000 | INHALATION_SPRAY | Freq: Once | RESPIRATORY_TRACT | Status: AC
  Administered 2018-12-12: 2 via RESPIRATORY_TRACT
  Filled 2018-12-12: qty 6.7

## 2018-12-12 MED ORDER — BENZONATATE 100 MG PO CAPS: 100.0000 mg | ORAL_CAPSULE | Freq: Four times a day (QID) | ORAL | 0 refills | Status: AC | PRN

## 2018-12-12 MED ORDER — PREDNISONE 10 MG PO TABS: 10.0000 mg | ORAL_TABLET | Freq: Every day | ORAL | 0 refills | Status: AC

## 2018-12-12 MED ORDER — ALBUTEROL SULFATE HFA 108 (90 BASE) MCG/ACT IN AERS: 2.0000 | INHALATION_SPRAY | Freq: Four times a day (QID) | RESPIRATORY_TRACT | 0 refills | Status: AC | PRN

## 2018-12-12 NOTE — ED Triage Notes (Signed)
Patient reports bilateral ear pain, cough and sore throat.  Patient reports throat feels like some thing is stuck, throat is non red and no obvious swelling.

## 2018-12-12 NOTE — Discharge Instructions (Addendum)
Please take medication as prescribed as needed for your cough.  Return to the ER for any worsening cough, shortness of breath or fevers above 101.  If Covid test is positive, please follow COVID-19 discharge instruction sheet, make sure you are social distancing.

## 2018-12-12 NOTE — ED Provider Notes (Signed)
[facility] EMERGENCY DEPARTMENT Provider Note   CSN: [id number] Arrival date & time: 12/12/18  2155     History   Chief Complaint Chief Complaint  Patient presents with  . Cough  . Sore Throat  . Otalgia    HPI  Angel Campbell is a 48 y.o. female.  Presents to the emergency department for evaluation of bilateral ear pain, cough and sore throat.  Symptoms began today.  She states couple days ago she scratched her right ear and is using antibiotic eardrops.  Denies any bleeding or drainage to the right ear.  No hearing loss.  She describes fullness to both the ears with a nonproductive dry cough with sore throat.  No fevers, chills, body aches, abdominal pain, nausea, vomiting, diarrhea, loss of taste.  She works at [employer] and there have been numerous employees have been positive for [employer].  She has not had any medications for her symptoms.  Has a history of COPD and does not have inhalers.  Is currently on Coumadin for history of blood clots.  She denies any chest pain or shortness of breath.     HPI  Past Medical History:  Diagnosis Date  . COPD (chronic obstructive pulmonary disease) (HCC)   . DVT of deep femoral vein (HCC) 2004   a. Chronic coumadin.  . Pulmonary embolism (HCC)   . Vertigo     Patient Active Problem List   Diagnosis Date Noted  . Encounter for monitoring Coumadin therapy 02/10/2017  . 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 Pulmonary emboli (HCC) 06/07/2015  . Thyroid nodule 06/07/2015    Past Surgical History:  Procedure Laterality Date  . arm surgery    . CESAREAN SECTION    . CHOLECYSTECTOMY    . TOE SURGERY  May 2015     OB History   No obstetric history on file.      Home Medications    Prior to Admission medications   Medication Sig Start Date End Date Taking? Authorizing Provider  albuterol (VENTOLIN HFA) 108 (90 Base) MCG/ACT inhaler Inhale 2 puffs into the lungs every 6 (six) hours as needed for  wheezing or shortness of breath. 12/12/18   Evon SlackGaines, Breyton Vanscyoc C, PA-C  benzonatate (TESSALON PERLES) 100 MG capsule Take 1 capsule (100 mg total) by mouth every 6 (six) hours as needed for cough. 12/12/18 12/12/19  Evon SlackGaines, Camrie Stock C, PA-C  neomycin-polymyxin-hydrocortisone (CORTISPORIN) OTIC solution Place 3 drops into the right ear 3 (three) times daily for 10 days. 12/10/18 12/20/18  Joni ReiningSmith, Ronald K, PA-C  predniSONE (DELTASONE) 10 MG tablet Take 1 tablet (10 mg total) by mouth daily. 6,5,4,3,2,1 six day taper 12/12/18   Evon SlackGaines, Breia Ocampo C, PA-C  warfarin (COUMADIN) 10 MG tablet Take 1/2 tablet daily except 1 tablet Monday, Wednesday, Friday or as directed by [facility]. 10/19/18   Creig HinesBerge, Christopher Ronald, NP    Family History Family History  Problem Relation Age of Onset  . Diabetes Mother   . Stroke Mother   . Arrhythmia Mother   . AAA (abdominal aortic aneurysm) Father     Social History Social History   Tobacco Use  . Smoking status: Former Games developermoker  . Smokeless tobacco: Never Used  Substance Use Topics  . Alcohol use: No  . Drug use: No     Allergies   Sodium hypochlorite and Tape   Review of Systems Review of Systems  Constitutional: Negative for fever.  HENT: Positive for congestion, ear pain, rhinorrhea  and sore throat. Negative for ear discharge, sinus pressure, sinus pain, trouble swallowing and voice change.   Respiratory: Positive for cough. Negative for shortness of breath, wheezing and stridor.   Cardiovascular: Negative for chest pain.  Gastrointestinal: Negative for abdominal pain, diarrhea, nausea and vomiting.  Genitourinary: Negative for dysuria, flank pain and pelvic pain.  Musculoskeletal: Negative for back pain and myalgias.  Skin: Negative for rash.  Neurological: Negative for dizziness and headaches.     Physical Exam Updated Vital Signs BP (!) 182/96 (BP Location: Left Arm)   Pulse 87   Temp 98.5 F (36.9 C) (Oral)   Resp 18   Ht 5\' 9"   (1.753 m)   Wt 130.6 kg   SpO2 98%   BMI 42.53 kg/m   Physical Exam Constitutional:      General: She is not in acute distress.    Appearance: She is well-developed.  HENT:     Head: Normocephalic and atraumatic.     Jaw: No trismus.     Comments: Right ear canal with mild superficial abrasion to the canal with no active bleeding.  No signs of any canal infection.    Right Ear: Hearing, tympanic membrane and external ear normal. No tenderness. No middle ear effusion. Tympanic membrane is not erythematous.     Left Ear: Hearing, tympanic membrane, ear canal and external ear normal. No tenderness.  No middle ear effusion. Tympanic membrane is not erythematous.     Nose: Rhinorrhea present.     Mouth/Throat:     Pharynx: Oropharynx is clear. Uvula midline. Posterior oropharyngeal erythema present. No pharyngeal swelling, oropharyngeal exudate or uvula swelling.     Tonsils: No tonsillar exudate or tonsillar abscesses.  Eyes:     General:        Right eye: No discharge.        Left eye: No discharge.     Conjunctiva/sclera: Conjunctivae normal.  Neck:     Musculoskeletal: Normal range of motion.  Cardiovascular:     Rate and Rhythm: Normal rate and regular rhythm.  Pulmonary:     Effort: Pulmonary effort is normal. No respiratory distress.     Breath sounds: Normal breath sounds. No stridor. No wheezing, rhonchi or rales.  Chest:     Chest wall: No tenderness.  Abdominal:     General: There is no distension.     Palpations: Abdomen is soft.     Tenderness: There is no abdominal tenderness.  Musculoskeletal: Normal range of motion.        General: No deformity.  Lymphadenopathy:     Cervical: Cervical adenopathy present.  Skin:    General: Skin is warm and dry.  Neurological:     Mental Status: She is alert and oriented to person, place, and time.     Deep Tendon Reflexes: Reflexes are normal and symmetric.  Psychiatric:        Behavior: Behavior normal.        Thought  Content: Thought content normal.      ED Treatments / Results  Labs (all labs ordered are listed, but only abnormal results are displayed) Labs Reviewed - No data to display  EKG None  Radiology No results found.  Procedures Procedures (including critical care time)  Medications Ordered in ED Medications  predniSONE (DELTASONE) tablet 60 mg (has no administration in time range)  albuterol (VENTOLIN HFA) 108 (90 Base) MCG/ACT inhaler 2 puff (has no administration in time range)     Initial Impression /  Assessment and Plan / ED Course  I have reviewed the triage vital signs and the nursing notes.  Pertinent labs & imaging results that were available during my care of the patient were reviewed by me and considered in my medical decision making (see chart for details).        48 year old female with 1 day history of bilateral ear pain, pressure, sore throat, cough.  Vital signs stable.  Physical exam normal.  Lungs clear to auscultation with no wheezing rales or rhonchi.  Will prescribe albuterol inhaler as she does not have 1.  We will also start her on a prednisone taper and give her Tessalon Perles.  She is Covid tested due to symptoms and exposure.  She understands signs symptoms to return to the ED for.  Final Clinical Impressions(s) / ED Diagnoses   Final diagnoses:  Viral upper respiratory tract infection  Abrasion of ear canal, right, subsequent encounter    ED Discharge Orders         Ordered    albuterol (VENTOLIN HFA) 108 (90 Base) MCG/ACT inhaler  Every 6 hours PRN     12/12/18 2304    predniSONE (DELTASONE) 10 MG tablet  Daily     12/12/18 2304    benzonatate (TESSALON PERLES) 100 MG capsule  Every 6 hours PRN     12/12/18 2305           Duanne Guess, PA-C 12/12/18 2314    Vanessa Arvin, MD 12/13/18 1159

## 2018-12-20 ENCOUNTER — Ambulatory Visit (INDEPENDENT_AMBULATORY_CARE_PROVIDER_SITE_OTHER): Payer: BC Managed Care – PPO

## 2018-12-20 ENCOUNTER — Other Ambulatory Visit: Payer: Self-pay

## 2018-12-20 DIAGNOSIS — Z5181 Encounter for therapeutic drug level monitoring: Secondary | ICD-10-CM | POA: Diagnosis not present

## 2018-12-20 DIAGNOSIS — Z7901 Long term (current) use of anticoagulants: Secondary | ICD-10-CM | POA: Diagnosis not present

## 2018-12-20 DIAGNOSIS — I8290 Acute embolism and thrombosis of unspecified vein: Secondary | ICD-10-CM

## 2018-12-20 LAB — POCT INR: INR: 5.7 — AB (ref 2.0–3.0)

## 2018-12-20 NOTE — Patient Instructions (Signed)
Please skip warfarin today & tomorrow, take 5 mg on Wednesday, then resume dosage of 5 mg every day except 10 mg on MONDAYS, Angel Campbell. Recheck in 2 weeks.

## 2019-01-03 ENCOUNTER — Ambulatory Visit (INDEPENDENT_AMBULATORY_CARE_PROVIDER_SITE_OTHER): Payer: BC Managed Care – PPO

## 2019-01-03 ENCOUNTER — Other Ambulatory Visit: Payer: Self-pay

## 2019-01-03 DIAGNOSIS — Z5181 Encounter for therapeutic drug level monitoring: Secondary | ICD-10-CM | POA: Diagnosis not present

## 2019-01-03 DIAGNOSIS — Z7901 Long term (current) use of anticoagulants: Secondary | ICD-10-CM

## 2019-01-03 DIAGNOSIS — I8290 Acute embolism and thrombosis of unspecified vein: Secondary | ICD-10-CM | POA: Diagnosis not present

## 2019-01-03 LAB — POCT INR: INR: 2.7 (ref 2.0–3.0)

## 2019-01-03 NOTE — Patient Instructions (Signed)
Please continue dosage of 5 mg every day except 10 mg on MONDAYS, [place]. Recheck in 3 weeks.

## 2019-03-07 ENCOUNTER — Other Ambulatory Visit: Payer: Self-pay

## 2019-03-07 ENCOUNTER — Ambulatory Visit (INDEPENDENT_AMBULATORY_CARE_PROVIDER_SITE_OTHER): Payer: BC Managed Care – PPO

## 2019-03-07 DIAGNOSIS — Z5181 Encounter for therapeutic drug level monitoring: Secondary | ICD-10-CM

## 2019-03-07 DIAGNOSIS — I8290 Acute embolism and thrombosis of unspecified vein: Secondary | ICD-10-CM

## 2019-03-07 DIAGNOSIS — Z7901 Long term (current) use of anticoagulants: Secondary | ICD-10-CM | POA: Diagnosis not present

## 2019-03-07 LAB — POCT INR: INR: 2.8 (ref 2.0–3.0)

## 2019-03-07 NOTE — Progress Notes (Signed)
Pt's mother passed away last month. She was a pt in my clinic, as well, so pt was tearful today since her mother was not w/ her.  Pt has a lot of financial/personal issues right now and has not had time to mourn.  She is hopeful and has a plan to get herself and son back on track, but wanted to vent today.

## 2019-03-07 NOTE — Patient Instructions (Signed)
Please continue dosage of 5 mg every day except 10 mg on MONDAYS, WEDNESDAYS & FRIDAYS. Recheck in 4 weeks.

## 2019-04-04 ENCOUNTER — Ambulatory Visit (INDEPENDENT_AMBULATORY_CARE_PROVIDER_SITE_OTHER): Payer: BC Managed Care – PPO

## 2019-04-04 ENCOUNTER — Other Ambulatory Visit: Payer: Self-pay

## 2019-04-04 DIAGNOSIS — Z5181 Encounter for therapeutic drug level monitoring: Secondary | ICD-10-CM

## 2019-04-04 DIAGNOSIS — Z7901 Long term (current) use of anticoagulants: Secondary | ICD-10-CM | POA: Diagnosis not present

## 2019-04-04 DIAGNOSIS — I8290 Acute embolism and thrombosis of unspecified vein: Secondary | ICD-10-CM | POA: Diagnosis not present

## 2019-04-04 LAB — POCT INR: INR: 1.1 — AB (ref 2.0–3.0)

## 2019-04-04 NOTE — Patient Instructions (Signed)
-   Take 2 tablets today, 1.5 tablets tomorrow, then resume dosage of 5 mg every day except 10 mg on MONDAYS, WEDNESDAYS & FRIDAYS. - Recheck in 3 weeks.

## 2019-04-25 ENCOUNTER — Other Ambulatory Visit: Payer: Self-pay

## 2019-04-25 ENCOUNTER — Ambulatory Visit (INDEPENDENT_AMBULATORY_CARE_PROVIDER_SITE_OTHER): Payer: BC Managed Care – PPO

## 2019-04-25 DIAGNOSIS — Z5181 Encounter for therapeutic drug level monitoring: Secondary | ICD-10-CM | POA: Diagnosis not present

## 2019-04-25 DIAGNOSIS — I8290 Acute embolism and thrombosis of unspecified vein: Secondary | ICD-10-CM

## 2019-04-25 DIAGNOSIS — Z7901 Long term (current) use of anticoagulants: Secondary | ICD-10-CM

## 2019-04-25 LAB — POCT INR: INR: 1.8 — AB (ref 2.0–3.0)

## 2019-04-25 NOTE — Patient Instructions (Signed)
-   Take 1.5 tablets today & tomorrow, then resume dosage of 5 mg every day except 10 mg on MONDAYS, WEDNESDAYS & FRIDAYS. - Recheck in 4 weeks.

## 2019-05-21 ENCOUNTER — Telehealth: Payer: Self-pay | Admitting: Cardiovascular Disease

## 2019-05-23 NOTE — Telephone Encounter (Signed)
Refill Request.  

## 2019-05-23 NOTE — Telephone Encounter (Signed)
Patient calling to check on status  If there are any issues please call to discuss

## 2019-06-10 ENCOUNTER — Emergency Department: Payer: BC Managed Care – PPO

## 2019-06-10 ENCOUNTER — Other Ambulatory Visit: Payer: Self-pay

## 2019-06-10 ENCOUNTER — Emergency Department
Admission: EM | Admit: 2019-06-10 | Discharge: 2019-06-10 | Disposition: A | Discharge status: Home / Self Care | Payer: BC Managed Care – PPO | Attending: Emergency Medicine | Admitting: Emergency Medicine

## 2019-06-10 DIAGNOSIS — N83202 Unspecified ovarian cyst, left side: Secondary | ICD-10-CM | POA: Diagnosis not present

## 2019-06-10 DIAGNOSIS — R109 Unspecified abdominal pain: Secondary | ICD-10-CM | POA: Diagnosis present

## 2019-06-10 DIAGNOSIS — N838 Other noninflammatory disorders of ovary, fallopian tube and broad ligament: Secondary | ICD-10-CM

## 2019-06-10 DIAGNOSIS — J449 Chronic obstructive pulmonary disease, unspecified: Secondary | ICD-10-CM | POA: Diagnosis not present

## 2019-06-10 DIAGNOSIS — Z7901 Long term (current) use of anticoagulants: Secondary | ICD-10-CM | POA: Insufficient documentation

## 2019-06-10 DIAGNOSIS — Z87891 Personal history of nicotine dependence: Secondary | ICD-10-CM | POA: Diagnosis not present

## 2019-06-10 LAB — BASIC METABOLIC PANEL
Anion gap: 11 (ref 5–15)
BUN: 17 mg/dL (ref 6–20)
CO2: 23 mmol/L (ref 22–32)
Calcium: 9 mg/dL (ref 8.9–10.3)
Chloride: 102 mmol/L (ref 98–111)
Creatinine, Ser: 0.77 mg/dL (ref 0.44–1.00)
GFR calc Af Amer: >60 mL/min (ref >60)
GFR calc non Af Amer: >60 mL/min (ref >60)
Glucose, Bld: 327 mg/dL — ABNORMAL HIGH (ref 70–99)
Potassium: 4.5 mmol/L (ref 3.5–5.1)
Sodium: 136 mmol/L (ref 135–145)

## 2019-06-10 LAB — URINALYSIS, COMPLETE (UACMP) WITH MICROSCOPIC
Bacteria, UA: NONE SEEN
Bilirubin Urine: NEGATIVE
Glucose, UA: >=500 mg/dL — AB
Hgb urine dipstick: NEGATIVE
Ketones, ur: 5 mg/dL — AB
Nitrite: NEGATIVE
Protein, ur: 30 mg/dL — AB
Specific Gravity, Urine: 1.029 (ref 1.005–1.030)
pH: 5 (ref 5.0–8.0)

## 2019-06-10 LAB — PROTIME-INR
INR: 2.5 — ABNORMAL HIGH (ref 0.8–1.2)
Prothrombin Time: 26.8 seconds — ABNORMAL HIGH (ref 11.4–15.2)

## 2019-06-10 LAB — CBC
HCT: 42.8 % (ref 36.0–46.0)
Hemoglobin: 14.1 g/dL (ref 12.0–15.0)
MCH: 29.1 pg (ref 26.0–34.0)
MCHC: 32.9 g/dL (ref 30.0–36.0)
MCV: 88.2 fL (ref 80.0–100.0)
Platelets: 271 10*3/uL (ref 150–400)
RBC: 4.85 MIL/uL (ref 3.87–5.11)
RDW: 13.6 % (ref 11.5–15.5)
WBC: 6.8 10*3/uL (ref 4.0–10.5)
nRBC: 0 % (ref 0.0–0.2)

## 2019-06-10 MED ORDER — DOCUSATE SODIUM 100 MG PO CAPS: ORAL_CAPSULE | ORAL | 0 refills | Status: AC

## 2019-06-10 MED ORDER — IOHEXOL 350 MG/ML SOLN
125.0000 mL | Freq: Once | INTRAVENOUS | Status: AC | PRN
  Administered 2019-06-10: 125 mL via INTRAVENOUS

## 2019-06-10 MED ORDER — HYDROCODONE-ACETAMINOPHEN 5-325 MG PO TABS: 2.0000 | ORAL_TABLET | Freq: Four times a day (QID) | ORAL | 0 refills | Status: AC | PRN

## 2019-06-10 MED ORDER — FOSFOMYCIN TROMETHAMINE 3 G PO PACK
3.0000 g | PACK | Freq: Once | ORAL | Status: AC
  Administered 2019-06-10: 08:00:00 3 g via ORAL
  Filled 2019-06-10: qty 3

## 2019-06-10 NOTE — ED Triage Notes (Signed)
Patient c/o right flank pain for the last few days. Patient denies urinary changes. Patient has been drinking cranberry juice to help with pain with no relief. Patient reports urine is dark. Patient is tender to palpation/pressure of right flank.

## 2019-06-10 NOTE — ED Notes (Signed)
ED Provider at bedside. 

## 2019-06-10 NOTE — ED Provider Notes (Signed)
[facility] Emergency Department Provider Note  ____________________________________________   First MD Initiated Contact with Patient 06/10/19 [phone]     (approximate)  I have reviewed the triage vital signs and the nursing notes.   HISTORY  Chief Complaint Flank Pain    HPI Rubylee Ertle is a 49 y.o. female with medical history as listed below  who presents for evaluation of about 4 days of gradually worsening right flank pain.  She has no history of trauma or pulled muscles of which she is aware.  She describes it as sharp and aching and worse with movement.  She has also noticed that her urine seems to have gotten darker in spite of her drinking extra fluids and cranberry juice thinking it might be a urinary tract infection.  She says that over the last 24 hours or so she thinks the urine also has a slightly abnormal odor.  She has no dysuria and has not seen any blood in her urine.  She has no history of kidney stones.  She denies fever, chest pain, shortness of breath, and abdominal pain.  She has had some nausea particularly over the last 24 hours and at least one episode of vomiting.  Her history is notable for extensive blood clots in her legs as well as pulmonary emboli and takes warfarin for anticoagulation and has done so for years.  No recent medication changes and her last INR check was about 2 weeks ago and she was in therapeutic range, between 2 and 3.        Past Medical History:  Diagnosis Date  . COPD (chronic obstructive pulmonary disease) (HCC)   . DVT of deep femoral vein (HCC) 2004   a. Chronic coumadin.  . Pulmonary embolism (HCC)   . Vertigo     Patient Active Problem List   Diagnosis Date Noted  . Encounter for monitoring Coumadin therapy 02/10/2017  . 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 Pulmonary emboli (HCC) 06/07/2015  . Thyroid nodule 06/07/2015    Past Surgical History:  Procedure Laterality Date  . arm  surgery    . CESAREAN SECTION    . CHOLECYSTECTOMY    . TOE SURGERY  May 2015    Prior to Admission medications   Medication Sig Start Date End Date Taking? Authorizing Provider  albuterol (VENTOLIN HFA) 108 (90 Base) MCG/ACT inhaler Inhale 2 puffs into the lungs every 6 (six) hours as needed for wheezing or shortness of breath. 12/12/18   Evon Slack, PA-C  benzonatate (TESSALON PERLES) 100 MG capsule Take 1 capsule (100 mg total) by mouth every 6 (six) hours as needed for cough. 12/12/18 12/12/19  Evon Slack, PA-C  docusate sodium (COLACE) 100 MG capsule Take 1 tablet once or twice daily as needed for constipation while taking narcotic pain medicine 06/10/19   Loleta Rose, MD  HYDROcodone-acetaminophen (NORCO/VICODIN) 5-325 MG tablet Take 2 tablets by mouth every 6 (six) hours as needed for moderate pain or severe pain. 06/10/19   Loleta Rose, MD  predniSONE (DELTASONE) 10 MG tablet Take 1 tablet (10 mg total) by mouth daily. 6,5,4,3,2,1 six day taper 12/12/18   Evon Slack, PA-C  warfarin (COUMADIN) 10 MG tablet TAKE 1/2 TABLET DAILY EXCEPT 1 TABLET ON MONDAY, WEDNESDAY AND FRIDAY OR AS DIRECTED BY COUMADIN CLINIC 05/23/19   Iran Ouch, MD    Allergies Sodium hypochlorite and Tape  Family History  Problem Relation Age of Onset  . Diabetes  Mother   . Stroke Mother   . Arrhythmia Mother   . AAA (abdominal aortic aneurysm) Father     Social History Social History   Tobacco Use  . Smoking status: Former Games developer  . Smokeless tobacco: Never Used  Substance Use Topics  . Alcohol use: No  . Drug use: No    Review of Systems Constitutional: No fever/chills Eyes: No visual changes. ENT: No sore throat. Cardiovascular: Denies chest pain. Respiratory: Denies shortness of breath. Gastrointestinal: No abdominal pain.  No nausea, no vomiting.  No diarrhea.  No constipation. Genitourinary: Negative for dysuria. Musculoskeletal: Right flank pain.  Negative for  neck pain.   Integumentary: Negative for rash. Neurological: Negative for headaches, focal weakness or numbness.   ____________________________________________   PHYSICAL EXAM:  VITAL SIGNS: ED Triage Vitals  Enc Vitals Group     BP 06/10/19 0232 (!) 154/63     Pulse Rate 06/10/19 0232 89     Resp 06/10/19 0232 18     Temp 06/10/19 0232 97.6 F (36.4 C)     Temp src --      SpO2 06/10/19 0232 95 %     Weight 06/10/19 0233 127 kg (280 lb)     Height 06/10/19 0233 1.753 m (5\' 9" )     Head Circumference --      Peak Flow --      Pain Score 06/10/19 0233 8     Pain Loc --      Pain Edu? --      Excl. in GC? --     Constitutional: Alert and oriented.  Generally well-appearing and at this moment does not appear uncomfortable. Eyes: Conjunctivae are normal.  Head: Atraumatic. Nose: No congestion/rhinnorhea. Mouth/Throat: Patient is wearing a mask. Neck: No stridor.  No meningeal signs.   Cardiovascular: Normal rate, regular rhythm. Good peripheral circulation. Grossly normal heart sounds. Respiratory: Normal respiratory effort.  No retractions. Gastrointestinal: Soft and nondistended.  Minimal tenderness to the right lateral part of the abdomen, more notable with right CVA percussion. Musculoskeletal: Right CVA tenderness to percussion.  No lower extremity tenderness nor edema. No gross deformities of extremities. Neurologic:  Normal speech and language. No gross focal neurologic deficits are appreciated.  Skin:  Skin is warm, dry and intact. Psychiatric: Mood and affect are normal. Speech and behavior are normal.  ____________________________________________   LABS (all labs ordered are listed, but only abnormal results are displayed)  Labs Reviewed  URINALYSIS, COMPLETE (UACMP) WITH MICROSCOPIC - Abnormal; Notable for the following components:      Result Value   Color, Urine YELLOW (*)    APPearance CLOUDY (*)    Glucose, UA >=500 (*)    Ketones, ur 5 (*)     Protein, ur 30 (*)    Leukocytes,Ua TRACE (*)    All other components within normal limits  BASIC METABOLIC PANEL - Abnormal; Notable for the following components:   Glucose, Bld 327 (*)    All other components within normal limits  PROTIME-INR - Abnormal; Notable for the following components:   Prothrombin Time 26.8 (*)    INR 2.5 (*)    All other components within normal limits  URINE CULTURE  CBC   ____________________________________________  EKG  No indication for emergent EKG ____________________________________________  RADIOLOGY I, 06/12/19, personally viewed and evaluated these images (plain radiographs) as part of my medical decision making, as well as reviewing the written report by the radiologist.  ED MD interpretation: No acute  abnormalities identified on noncontrast CT scan to explain the patient's pain.  She has a left cystic ovarian mass that needs outpatient follow-up.  CTA of the abdomen and pelvis also does not identify any acute vascular abnormalities.  Official radiology report(s): CT Renal Stone Study  Result Date: 06/10/2019 CLINICAL DATA:  49 year old female with history of flank pain and dark urine. Suspected kidney stone. EXAM: CT ABDOMEN AND PELVIS WITHOUT CONTRAST TECHNIQUE: Multidetector CT imaging of the abdomen and pelvis was performed following the standard protocol without IV contrast. COMPARISON:  No priors. FINDINGS: Lower chest: Unremarkable. Hepatobiliary: Diffuse low attenuation throughout the hepatic parenchyma, indicative of severe hepatic steatosis. 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 No definite cystic or solid hepatic lesions are confidently identified on today's noncontrast CT examination. Status post cholecystectomy. Pancreas: No definite pancreatic mass or peripancreatic fluid collections or inflammatory changes are noted on  today's noncontrast CT examination. Spleen: Unremarkable. Adrenals/Urinary Tract: There are no abnormal calcifications within the collecting system of either kidney, along the course of either ureter, or within the lumen of the urinary bladder. No hydroureteronephrosis or perinephric stranding to suggest urinary tract obstruction at this time. The unenhanced appearance of the kidneys is unremarkable bilaterally. Unenhanced appearance of the urinary bladder is normal. Bilateral adrenal glands are normal in appearance. Stomach/Bowel: Unenhanced appearance of the stomach is normal. No pathologic dilatation of small bowel or colon. Normal appendix. Vascular/Lymphatic: Aortic atherosclerosis, without evidence of aneurysm or dissection in the abdominal or pelvic vasculature. No lymphadenopathy noted in the abdomen or pelvis. Reproductive: Uterus and right ovary are unremarkable in appearance. In the left adnexa there is a well-circumscribed solid appearing intermediate attenuation (51 HU) lesion measuring 4.0 x 2.5 x 3.9 cm (axial image 77 of series 2 and coronal image 84 of series 5), presumably an ovarian lesion, but incompletely characterized on today's noncontrast CT examination. Other: No significant volume of ascites.  No pneumoperitoneum. Musculoskeletal: There are no aggressive appearing lytic or blastic lesions noted in the visualized portions of the skeleton. IMPRESSION: 1. No acute findings are noted in the abdomen or pelvis to account for the patient's symptoms. Specifically, no urinary tract calculi no findings of urinary tract obstruction. 2. Severe hepatic steatosis. 3. 4.0 x 2.5 x 3.9 cm solid appearing left adnexal lesion, presumably of ovarian origin. Further evaluation with nonemergent outpatient pelvic ultrasound is recommended in the near future to better characterize this finding and exclude neoplasm. 4. Aortic atherosclerosis. 5. Additional incidental findings, as above. Electronically Signed   By:  Trudie Reedaniel  Entrikin M.D.   On: 06/10/2019 05:20   CT Angio Abd/Pel W and/or Wo Contrast  Result Date: 06/10/2019 CLINICAL DATA:  49 year old female with history of flank pain. Right-sided flank pain. Evaluate for right renal artery thromboembolism. EXAM: CTA ABDOMEN AND PELVIS WITHOUT AND WITH CONTRAST TECHNIQUE: Multidetector CT imaging of the abdomen and pelvis was performed using the standard protocol during bolus administration of intravenous contrast. Multiplanar reconstructed images and MIPs were obtained and reviewed to evaluate the vascular anatomy. CONTRAST:  125mL OMNIPAQUE IOHEXOL 350 MG/ML SOLN COMPARISON:  No priors. FINDINGS: VASCULAR Aorta: Normal caliber aorta without aneurysm, dissection, vasculitis or significant stenosis. Celiac: Patent without evidence of aneurysm, dissection, vasculitis or significant stenosis. SMA: Patent without evidence of aneurysm, dissection, vasculitis or significant stenosis. Renals: Both renal arteries are patent without evidence of aneurysm, dissection, vasculitis, fibromuscular dysplasia or significant stenosis. IMA: Patent without  evidence of aneurysm, dissection, vasculitis or significant stenosis. Inflow: Patent without evidence of aneurysm, dissection, vasculitis or significant stenosis. Proximal Outflow: Bilateral common femoral and visualized portions of the superficial and profunda femoral arteries are patent without evidence of aneurysm, dissection, vasculitis or significant stenosis. Veins: No obvious venous abnormality within the limitations of this arterial phase study. Review of the MIP images confirms the above findings. NON-VASCULAR Lower chest: Unremarkable. Hepatobiliary: Severe diffuse low attenuation throughout the hepatic parenchyma, indicative of hepatic steatosis. No suspicious cystic or solid hepatic lesions. No intra or extrahepatic biliary ductal dilatation. Enlarged craniocaudal span of the liver measuring 26.2 cm, predominantly involving the  right lobe, favored to reflect a Riedel's lobe variant. Status post cholecystectomy. Pancreas: No pancreatic mass. No pancreatic ductal dilatation. No pancreatic or peripancreatic fluid collections or inflammatory changes. Spleen: Unremarkable. Adrenals/Urinary Tract: Bilateral kidneys and adrenal glands are normal in appearance. No hydroureteronephrosis. Urinary bladder is normal in appearance. Stomach/Bowel: Normal appearance of the stomach. No pathologic dilatation of small bowel or colon. Normal appendix. Lymphatic: No lymphadenopathy noted in the abdomen or pelvis. Reproductive: Uterus and right ovary are unremarkable in appearance. In the left adnexa there is again a 4.0 x 2.5 x 3.9 cm lesion which is solid in appearance. Other: No significant volume of ascites.  No pneumoperitoneum. Musculoskeletal: There are no aggressive appearing lytic or blastic lesions noted in the visualized portions of the skeleton. IMPRESSION: VASCULAR 1. No acute vascular abnormality in the abdomen or pelvis to account for the patient's symptoms. Specifically, no findings to suggest thromboembolism involving the right renal artery. NON-VASCULAR 1. No acute nonvascular findings noted in the abdomen or pelvis. 2. Severe hepatic steatosis. 3. Solid-appearing left adnexal lesion, likely of ovarian origin. Follow-up nonemergent outpatient pelvic ultrasound is strongly recommended in the near future to better evaluate this finding. 4. Additional incidental findings, as above. Electronically Signed   By: Vinnie Langton M.D.   On: 06/10/2019 07:36    ____________________________________________   PROCEDURES   Procedure(s) performed (including Critical Care):  Procedures   ____________________________________________   INITIAL IMPRESSION / MDM / [place] / ED COURSE  As part of my medical decision making, I reviewed the following data within the [place] notes reviewed and  incorporated, Labs reviewed , Old chart reviewed, Notes from prior ED visits and [place] Controlled Substance Database   Differential diagnosis includes, but is not limited to, kidney/ureteral stone, UTI/pyelonephritis, musculoskeletal pain/strain, renal artery dissection or thrombosis.  The patient's urinalysis was generally unremarkable and not suggestive of severe urinary tract infection or pyelonephritis.  I ordered a urine culture and will treat her empirically with fosfomycin 3 g by mouth given her report that her urine has been darker with an odd odor.  However I do not believe she has pyelonephritis.  Her lab work is reassuring with no significant abnormalities on her basic metabolic panel nor her CBC including no leukocytosis.  Renal stone protocol CT scan is pending to evaluate for possible obstructive uropathy or even a small ureteral stone that could explain her symptoms.  She agrees with the plan and is declining analgesia and antiemetics at this time.   Clinical Course as of Jun 10 807  Fri Jun 10, 2019  0549 The patient's CT renal stone protocol did not identify an acute or emergent cause for her right flank pain including no obstructive uropathy.  He had identified a cystic mass on the left ovary that needs further outpatient assessment and I  already talked to her about scheduling outpatient follow-up visit to talk with her provider about scheduling a pelvic ultrasound.After learning more about her medical history and seeing that she had extensive history of DVT and pulmonary embolism, I talked to her about it.  She says she has been compliant with her warfarin but her INR has not been checked for a couple of weeks and she is scheduled for repeat INR check and about 4 more days.  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  I discussed this with the patient and she agrees with the plan for evaluation with CTA abdomen/pelvis.  I am also checking a pro time INR to see if she is currently appropriately anticoagulated.  I offered analgesia and antiemetics but she said the pain is better right now as long as she is not moving and she declined any medication.   [CF]  [id number] Therapeutic INR  INR(!): 2.5 [CF]  0740 No evidence of renal artery thombosis.  Will update patient and discharge with outpatient follow up recommendations.  CT Angio Abd/Pel W and/or Wo Contrast [CF]    Clinical Course User Index [CF] Loleta Rose, MD     ____________________________________________  FINAL CLINICAL IMPRESSION(S) / ED DIAGNOSES  Final diagnoses:  Acute right flank pain  Ovarian mass, left     MEDICATIONS GIVEN DURING THIS VISIT:  Medications  iohexol (OMNIPAQUE) 350 MG/ML injection 125 mL (125 mLs Intravenous Contrast Given 06/10/19 0630)  fosfomycin (MONUROL) packet 3 g (3 g Oral Given 06/10/19 0753)     ED Discharge Orders         Ordered    HYDROcodone-acetaminophen (NORCO/VICODIN) 5-325 MG tablet  Every 6 hours PRN     06/10/19 0744    docusate sodium (COLACE) 100 MG capsule     06/10/19 0744          *Please note:  Ica Daye was evaluated in Emergency Department on 06/10/2019 for the symptoms described in the history of present illness. She was evaluated in the context of the global COVID-19 pandemic, which necessitated consideration that the patient might be at risk for infection with the SARS-CoV-2 virus that causes COVID-19. Institutional protocols and algorithms that pertain to the evaluation of patients at risk for COVID-19 are in a state of rapid change based on information released by regulatory bodies including the CDC and federal and state organizations. These policies and algorithms were followed during the  patient's care in the ED.  Some ED evaluations and interventions may be delayed as a result of limited staffing during the pandemic.*  Note:  This document was prepared using Dragon voice recognition software and may include unintentional dictation errors.   Loleta Rose, MD 06/10/19 [phone]

## 2019-06-10 NOTE — Discharge Instructions (Addendum)
As we discussed, your work-up is generally reassuring today.  There is no clear indication of why you are having the pain in your right flank.  Given the possibility of urinary tract infection, I gave you a one-time dose of antibiotics which should treat a mild urinary tract infection, but based on your urinalysis and your blood work and imaging, it appears very unlikely that you have a bladder infection that has gone up to your kidneys (called pyelonephritis).  I recommend you take over-the-counter ibuprofen and Tylenol as scheduled medication doses according to label instructions.  As we also discussed, your CT scan revealed an ovarian mass or cyst on your left ovary.  This is most likely benign and nothing to worry about, but the radiologist recommended that you follow-up with your regular doctor to schedule an outpatient pelvic ultrasound for further evaluation and characterization of the mass to make sure it is nothing that you need to be concerned about.  If you would prefer to follow-up with OB/GYN, I provided the name and number of an OB/GYN provider with whom you can follow-up.    Return to the emergency department if you develop new or worsening symptoms that concern you.

## 2019-06-10 NOTE — ED Notes (Signed)
Patient returned from CT

## 2019-06-10 NOTE — ED Notes (Signed)
Patient transported to CT 

## 2019-06-11 LAB — URINE CULTURE: Special Requests: NORMAL

## 2019-06-13 ENCOUNTER — Ambulatory Visit (INDEPENDENT_AMBULATORY_CARE_PROVIDER_SITE_OTHER): Payer: BC Managed Care – PPO

## 2019-06-13 ENCOUNTER — Other Ambulatory Visit: Payer: Self-pay

## 2019-06-13 DIAGNOSIS — I8290 Acute embolism and thrombosis of unspecified vein: Secondary | ICD-10-CM | POA: Diagnosis not present

## 2019-06-13 DIAGNOSIS — Z7901 Long term (current) use of anticoagulants: Secondary | ICD-10-CM | POA: Diagnosis not present

## 2019-06-13 DIAGNOSIS — Z5181 Encounter for therapeutic drug level monitoring: Secondary | ICD-10-CM

## 2019-06-13 LAB — POCT INR: INR: 1.4 — AB (ref 2.0–3.0)

## 2019-06-13 NOTE — Patient Instructions (Signed)
-   Take 1.5 tablets today & tomorrow, then resume dosage of 5 mg every day except 10 mg on MONDAYS, WEDNESDAYS & FRIDAYS. - Recheck in 4 weeks

## 2019-07-11 ENCOUNTER — Ambulatory Visit (INDEPENDENT_AMBULATORY_CARE_PROVIDER_SITE_OTHER): Payer: BC Managed Care – PPO

## 2019-07-11 ENCOUNTER — Other Ambulatory Visit: Payer: Self-pay

## 2019-07-11 DIAGNOSIS — Z5181 Encounter for therapeutic drug level monitoring: Secondary | ICD-10-CM | POA: Diagnosis not present

## 2019-07-11 DIAGNOSIS — Z7901 Long term (current) use of anticoagulants: Secondary | ICD-10-CM

## 2019-07-11 DIAGNOSIS — I8290 Acute embolism and thrombosis of unspecified vein: Secondary | ICD-10-CM | POA: Diagnosis not present

## 2019-07-11 LAB — POCT INR: INR: 2.3 (ref 2.0–3.0)

## 2019-07-11 NOTE — Patient Instructions (Signed)
-   continue dosage of 5 mg every day except 10 mg on MONDAYS, WEDNESDAYS & FRIDAYS. - Recheck in 5 weeks

## 2019-08-15 ENCOUNTER — Ambulatory Visit (INDEPENDENT_AMBULATORY_CARE_PROVIDER_SITE_OTHER): Payer: BC Managed Care – PPO

## 2019-08-15 ENCOUNTER — Other Ambulatory Visit: Payer: Self-pay

## 2019-08-15 DIAGNOSIS — I8290 Acute embolism and thrombosis of unspecified vein: Secondary | ICD-10-CM

## 2019-08-15 DIAGNOSIS — Z5181 Encounter for therapeutic drug level monitoring: Secondary | ICD-10-CM

## 2019-08-15 DIAGNOSIS — Z7901 Long term (current) use of anticoagulants: Secondary | ICD-10-CM

## 2019-08-15 LAB — POCT INR: INR: 3.4 — AB (ref 2.0–3.0)

## 2019-08-15 NOTE — Patient Instructions (Signed)
-   skip warfarin tonight, then  - continue dosage of 5 mg every day except 10 mg on MONDAYS, WEDNESDAYS & FRIDAYS. - Recheck in 6 weeks

## 2019-08-23 ENCOUNTER — Other Ambulatory Visit: Payer: Self-pay | Admitting: Cardiovascular Disease

## 2019-08-23 NOTE — Telephone Encounter (Signed)
Please review for refill. Thanks!  

## 2019-10-05 ENCOUNTER — Telehealth: Payer: Self-pay

## 2019-10-05 NOTE — Telephone Encounter (Addendum)
Left message for pt to call and reschedule missed INR check. Pt also overdue to see provider, last ov w/ Ward Givens, NP on 07/29/18.

## 2019-10-17 NOTE — Telephone Encounter (Signed)
Left message for pt to call back and schedule INR check and appt w/ provider.

## 2019-11-02 NOTE — Telephone Encounter (Addendum)
Letter mailed to pt. Correction - attempted to mail letter to pt, but she does not have an address listed.

## 2019-11-09 ENCOUNTER — Inpatient Hospital Stay: Payer: BC Managed Care – PPO

## 2019-11-09 ENCOUNTER — Emergency Department: Payer: BC Managed Care – PPO

## 2019-11-09 ENCOUNTER — Inpatient Hospital Stay
Admission: EM | Admit: 2019-11-09 | Discharge: 2019-11-25 | DRG: 208 | Disposition: E | Payer: BC Managed Care – PPO | Attending: Internal Medicine | Admitting: Internal Medicine

## 2019-11-09 ENCOUNTER — Encounter: Payer: Self-pay | Admitting: Emergency Medicine

## 2019-11-09 ENCOUNTER — Other Ambulatory Visit: Payer: Self-pay

## 2019-11-09 DIAGNOSIS — U071 COVID-19: Secondary | ICD-10-CM | POA: Diagnosis present

## 2019-11-09 DIAGNOSIS — I5031 Acute diastolic (congestive) heart failure: Secondary | ICD-10-CM | POA: Diagnosis not present

## 2019-11-09 DIAGNOSIS — Z515 Encounter for palliative care: Secondary | ICD-10-CM | POA: Diagnosis not present

## 2019-11-09 DIAGNOSIS — Z4659 Encounter for fitting and adjustment of other gastrointestinal appliance and device: Secondary | ICD-10-CM

## 2019-11-09 DIAGNOSIS — Z6841 Body Mass Index (BMI) 40.0 and over, adult: Secondary | ICD-10-CM | POA: Diagnosis not present

## 2019-11-09 DIAGNOSIS — E1165 Type 2 diabetes mellitus with hyperglycemia: Secondary | ICD-10-CM | POA: Diagnosis present

## 2019-11-09 DIAGNOSIS — Z833 Family history of diabetes mellitus: Secondary | ICD-10-CM

## 2019-11-09 DIAGNOSIS — Z86718 Personal history of other venous thrombosis and embolism: Secondary | ICD-10-CM

## 2019-11-09 DIAGNOSIS — Z86711 Personal history of pulmonary embolism: Secondary | ICD-10-CM | POA: Diagnosis not present

## 2019-11-09 DIAGNOSIS — J440 Chronic obstructive pulmonary disease with acute lower respiratory infection: Secondary | ICD-10-CM | POA: Diagnosis present

## 2019-11-09 DIAGNOSIS — I82503 Chronic embolism and thrombosis of unspecified deep veins of lower extremity, bilateral: Secondary | ICD-10-CM | POA: Diagnosis not present

## 2019-11-09 DIAGNOSIS — Z7901 Long term (current) use of anticoagulants: Secondary | ICD-10-CM | POA: Diagnosis not present

## 2019-11-09 DIAGNOSIS — Z66 Do not resuscitate: Secondary | ICD-10-CM | POA: Diagnosis not present

## 2019-11-09 DIAGNOSIS — I82403 Acute embolism and thrombosis of unspecified deep veins of lower extremity, bilateral: Secondary | ICD-10-CM | POA: Diagnosis present

## 2019-11-09 DIAGNOSIS — Z823 Family history of stroke: Secondary | ICD-10-CM

## 2019-11-09 DIAGNOSIS — J449 Chronic obstructive pulmonary disease, unspecified: Secondary | ICD-10-CM | POA: Diagnosis present

## 2019-11-09 DIAGNOSIS — Z79899 Other long term (current) drug therapy: Secondary | ICD-10-CM

## 2019-11-09 DIAGNOSIS — Z87891 Personal history of nicotine dependence: Secondary | ICD-10-CM | POA: Diagnosis not present

## 2019-11-09 DIAGNOSIS — G4733 Obstructive sleep apnea (adult) (pediatric): Secondary | ICD-10-CM | POA: Diagnosis present

## 2019-11-09 DIAGNOSIS — E66813 Obesity, class 3: Secondary | ICD-10-CM | POA: Diagnosis present

## 2019-11-09 DIAGNOSIS — J9601 Acute respiratory failure with hypoxia: Secondary | ICD-10-CM

## 2019-11-09 DIAGNOSIS — E876 Hypokalemia: Secondary | ICD-10-CM | POA: Diagnosis present

## 2019-11-09 DIAGNOSIS — Z9049 Acquired absence of other specified parts of digestive tract: Secondary | ICD-10-CM

## 2019-11-09 DIAGNOSIS — J1282 Pneumonia due to coronavirus disease 2019: Secondary | ICD-10-CM | POA: Diagnosis present

## 2019-11-09 DIAGNOSIS — J9600 Acute respiratory failure, unspecified whether with hypoxia or hypercapnia: Secondary | ICD-10-CM | POA: Diagnosis present

## 2019-11-09 DIAGNOSIS — R710 Precipitous drop in hematocrit: Secondary | ICD-10-CM | POA: Diagnosis present

## 2019-11-09 DIAGNOSIS — J80 Acute respiratory distress syndrome: Secondary | ICD-10-CM | POA: Diagnosis present

## 2019-11-09 DIAGNOSIS — Z978 Presence of other specified devices: Secondary | ICD-10-CM

## 2019-11-09 DIAGNOSIS — G931 Anoxic brain damage, not elsewhere classified: Secondary | ICD-10-CM | POA: Diagnosis not present

## 2019-11-09 LAB — BLOOD GAS, VENOUS
Acid-Base Excess: 3.8 mmol/L — ABNORMAL HIGH (ref 0.0–2.0)
Bicarbonate: 29.2 mmol/L — ABNORMAL HIGH (ref 20.0–28.0)
O2 Saturation: 71.3 %
Patient temperature: 37
pCO2, Ven: 46 mmHg (ref 44.0–60.0)
pH, Ven: 7.41 (ref 7.250–7.430)
pO2, Ven: 37 mmHg (ref 32.0–45.0)

## 2019-11-09 LAB — CBC WITH DIFFERENTIAL/PLATELET
Abs Immature Granulocytes: 0.02 10*3/uL (ref 0.00–0.07)
Basophils Absolute: 0 10*3/uL (ref 0.0–0.1)
Basophils Relative: 0 %
Eosinophils Absolute: 0 10*3/uL (ref 0.0–0.5)
Eosinophils Relative: 0 %
HCT: 36.7 % (ref 36.0–46.0)
Hemoglobin: 12.9 g/dL (ref 12.0–15.0)
Immature Granulocytes: 1 %
Lymphocytes Relative: 18 %
Lymphs Abs: 0.6 10*3/uL — ABNORMAL LOW (ref 0.7–4.0)
MCH: 29.8 pg (ref 26.0–34.0)
MCHC: 35.1 g/dL (ref 30.0–36.0)
MCV: 84.8 fL (ref 80.0–100.0)
Monocytes Absolute: 0.1 10*3/uL (ref 0.1–1.0)
Monocytes Relative: 3 %
Neutro Abs: 2.7 10*3/uL (ref 1.7–7.7)
Neutrophils Relative %: 78 %
Platelets: 183 10*3/uL (ref 150–400)
RBC: 4.33 MIL/uL (ref 3.87–5.11)
RDW: 13.6 % (ref 11.5–15.5)
WBC: 3.5 10*3/uL — ABNORMAL LOW (ref 4.0–10.5)
nRBC: 0 % (ref 0.0–0.2)

## 2019-11-09 LAB — FIBRINOGEN: Fibrinogen: 551 mg/dL — ABNORMAL HIGH (ref 210–475)

## 2019-11-09 LAB — TRIGLYCERIDES: Triglycerides: 79 mg/dL (ref <150)

## 2019-11-09 LAB — COMPREHENSIVE METABOLIC PANEL
ALT: 45 U/L — ABNORMAL HIGH (ref 0–44)
AST: 66 U/L — ABNORMAL HIGH (ref 15–41)
Albumin: 3.2 g/dL — ABNORMAL LOW (ref 3.5–5.0)
Alkaline Phosphatase: 69 U/L (ref 38–126)
Anion gap: 12 (ref 5–15)
BUN: 8 mg/dL (ref 6–20)
CO2: 24 mmol/L (ref 22–32)
Calcium: 7.8 mg/dL — ABNORMAL LOW (ref 8.9–10.3)
Chloride: 96 mmol/L — ABNORMAL LOW (ref 98–111)
Creatinine, Ser: 0.72 mg/dL (ref 0.44–1.00)
GFR calc Af Amer: >60 mL/min (ref >60)
GFR calc non Af Amer: >60 mL/min (ref >60)
Glucose, Bld: 325 mg/dL — ABNORMAL HIGH (ref 70–99)
Potassium: 3.3 mmol/L — ABNORMAL LOW (ref 3.5–5.1)
Sodium: 132 mmol/L — ABNORMAL LOW (ref 135–145)
Total Bilirubin: 0.8 mg/dL (ref 0.3–1.2)
Total Protein: 7.1 g/dL (ref 6.5–8.1)

## 2019-11-09 LAB — PROCALCITONIN: Procalcitonin: 0.21 ng/mL

## 2019-11-09 LAB — GLUCOSE, CAPILLARY
Glucose-Capillary: 319 mg/dL — ABNORMAL HIGH (ref 70–99)
Glucose-Capillary: 339 mg/dL — ABNORMAL HIGH (ref 70–99)

## 2019-11-09 LAB — C-REACTIVE PROTEIN: CRP: 9.6 mg/dL — ABNORMAL HIGH (ref <1.0)

## 2019-11-09 LAB — PROTIME-INR
INR: 1.6 — ABNORMAL HIGH (ref 0.8–1.2)
Prothrombin Time: 18.6 seconds — ABNORMAL HIGH (ref 11.4–15.2)

## 2019-11-09 LAB — FIBRIN DERIVATIVES D-DIMER (ARMC ONLY): Fibrin derivatives D-dimer (ARMC): 1359.44 ng/mL (FEU) — ABNORMAL HIGH (ref 0.00–499.00)

## 2019-11-09 LAB — FERRITIN: Ferritin: 742 ng/mL — ABNORMAL HIGH (ref 11–307)

## 2019-11-09 LAB — HIV ANTIBODY (ROUTINE TESTING W REFLEX): HIV Screen 4th Generation wRfx: NONREACTIVE

## 2019-11-09 LAB — LACTATE DEHYDROGENASE: LDH: 485 U/L — ABNORMAL HIGH (ref 98–192)

## 2019-11-09 LAB — SARS CORONAVIRUS 2 BY RT PCR (HOSPITAL ORDER, PERFORMED IN ~~LOC~~ HOSPITAL LAB): SARS Coronavirus 2: POSITIVE — AB

## 2019-11-09 MED ORDER — ACETAMINOPHEN 325 MG PO TABS: 650.0000 mg | ORAL_TABLET | Freq: Four times a day (QID) | ORAL | Status: DC | PRN

## 2019-11-09 MED ORDER — ALBUTEROL SULFATE HFA 108 (90 BASE) MCG/ACT IN AERS
2.0000 | INHALATION_SPRAY | Freq: Four times a day (QID) | RESPIRATORY_TRACT | Status: DC | PRN
  Filled 2019-11-09: qty 6.7

## 2019-11-09 MED ORDER — SODIUM CHLORIDE 0.9 % IV SOLN
200.0000 mg | Freq: Once | INTRAVENOUS | Status: AC
  Administered 2019-11-09: 200 mg via INTRAVENOUS
  Filled 2019-11-09: qty 200

## 2019-11-09 MED ORDER — SODIUM CHLORIDE 0.9 % IV SOLN
1.0000 mg/kg | Freq: Two times a day (BID) | INTRAVENOUS | Status: DC
  Administered 2019-11-10 – 2019-11-12 (×4): 140 mg via INTRAVENOUS
  Filled 2019-11-09 (×8): qty 1.12

## 2019-11-09 MED ORDER — SODIUM CHLORIDE 0.9% FLUSH
3.0000 mL | Freq: Two times a day (BID) | INTRAVENOUS | Status: DC
  Administered 2019-11-09 – 2019-11-14 (×12): 3 mL via INTRAVENOUS

## 2019-11-09 MED ORDER — BARICITINIB 2 MG PO TABS
4.0000 mg | ORAL_TABLET | Freq: Every day | ORAL | Status: DC
  Administered 2019-11-09 – 2019-11-15 (×6): 4 mg via ORAL
  Filled 2019-11-09 (×7): qty 2

## 2019-11-09 MED ORDER — INSULIN DETEMIR 100 UNIT/ML ~~LOC~~ SOLN
0.1500 [IU]/kg | Freq: Two times a day (BID) | SUBCUTANEOUS | Status: DC
  Administered 2019-11-09 – 2019-11-10 (×3): 20 [IU] via SUBCUTANEOUS
  Filled 2019-11-09 (×4): qty 0.2

## 2019-11-09 MED ORDER — ONDANSETRON HCL 4 MG PO TABS: 4.0000 mg | ORAL_TABLET | Freq: Four times a day (QID) | ORAL | Status: DC | PRN

## 2019-11-09 MED ORDER — PREDNISONE 10 MG PO TABS
50.0000 mg | ORAL_TABLET | Freq: Every day | ORAL | Status: DC
  Administered 2019-11-12 – 2019-11-14 (×3): 50 mg via ORAL
  Filled 2019-11-09 (×3): qty 5

## 2019-11-09 MED ORDER — SENNOSIDES-DOCUSATE SODIUM 8.6-50 MG PO TABS: 1.0000 | ORAL_TABLET | Freq: Every evening | ORAL | Status: DC | PRN

## 2019-11-09 MED ORDER — IOHEXOL 350 MG/ML SOLN
100.0000 mL | Freq: Once | INTRAVENOUS | Status: AC | PRN
  Administered 2019-11-09: 100 mL via INTRAVENOUS

## 2019-11-09 MED ORDER — INSULIN ASPART 100 UNIT/ML ~~LOC~~ SOLN
0.0000 [IU] | Freq: Three times a day (TID) | SUBCUTANEOUS | Status: DC
  Administered 2019-11-09: 15 [IU] via SUBCUTANEOUS
  Administered 2019-11-10: 7 [IU] via SUBCUTANEOUS
  Filled 2019-11-09 (×2): qty 1

## 2019-11-09 MED ORDER — SODIUM CHLORIDE 0.9 % IV SOLN
250.0000 mL | INTRAVENOUS | Status: DC | PRN
  Administered 2019-11-11 – 2019-11-12 (×2): 250 mL via INTRAVENOUS

## 2019-11-09 MED ORDER — SODIUM CHLORIDE 0.9% FLUSH: 3.0000 mL | INTRAVENOUS | Status: DC | PRN

## 2019-11-09 MED ORDER — ASCORBIC ACID 500 MG PO TABS
500.0000 mg | ORAL_TABLET | Freq: Every day | ORAL | Status: DC
  Administered 2019-11-09 – 2019-11-15 (×6): 500 mg via ORAL
  Filled 2019-11-09 (×6): qty 1

## 2019-11-09 MED ORDER — METHYLPREDNISOLONE SODIUM SUCC 125 MG IJ SOLR
125.0000 mg | Freq: Once | INTRAMUSCULAR | Status: AC
  Administered 2019-11-09: 125 mg via INTRAVENOUS
  Filled 2019-11-09: qty 2

## 2019-11-09 MED ORDER — ENOXAPARIN SODIUM 40 MG/0.4ML ~~LOC~~ SOLN
40.0000 mg | SUBCUTANEOUS | Status: DC
  Administered 2019-11-09: 40 mg via SUBCUTANEOUS
  Filled 2019-11-09: qty 0.4

## 2019-11-09 MED ORDER — BENZONATATE 100 MG PO CAPS
100.0000 mg | ORAL_CAPSULE | Freq: Four times a day (QID) | ORAL | Status: DC | PRN
  Administered 2019-11-09 – 2019-11-13 (×3): 100 mg via ORAL
  Filled 2019-11-09 (×3): qty 1

## 2019-11-09 MED ORDER — SODIUM CHLORIDE 0.9 % IV SOLN
2.0000 g | Freq: Once | INTRAVENOUS | Status: AC
  Administered 2019-11-09: 2 g via INTRAVENOUS
  Filled 2019-11-09: qty 20

## 2019-11-09 MED ORDER — POTASSIUM CHLORIDE CRYS ER 20 MEQ PO TBCR
40.0000 meq | EXTENDED_RELEASE_TABLET | Freq: Once | ORAL | Status: AC
  Administered 2019-11-09: 40 meq via ORAL
  Filled 2019-11-09: qty 2

## 2019-11-09 MED ORDER — ZINC SULFATE 220 (50 ZN) MG PO CAPS
220.0000 mg | ORAL_CAPSULE | Freq: Every day | ORAL | Status: DC
  Administered 2019-11-09 – 2019-11-15 (×6): 220 mg via ORAL
  Filled 2019-11-09 (×6): qty 1

## 2019-11-09 MED ORDER — HYDROCODONE-ACETAMINOPHEN 5-325 MG PO TABS: 2.0000 | ORAL_TABLET | Freq: Four times a day (QID) | ORAL | Status: DC | PRN

## 2019-11-09 MED ORDER — SODIUM CHLORIDE 0.9 % IV SOLN
100.0000 mg | Freq: Every day | INTRAVENOUS | Status: AC
  Administered 2019-11-10 – 2019-11-13 (×4): 100 mg via INTRAVENOUS
  Filled 2019-11-09 (×2): qty 100
  Filled 2019-11-09: qty 20
  Filled 2019-11-09: qty 100

## 2019-11-09 MED ORDER — ONDANSETRON HCL 4 MG/2ML IJ SOLN: 4.0000 mg | Freq: Four times a day (QID) | INTRAMUSCULAR | Status: DC | PRN

## 2019-11-09 MED ORDER — DOCUSATE SODIUM 100 MG PO CAPS
200.0000 mg | ORAL_CAPSULE | Freq: Two times a day (BID) | ORAL | Status: DC
  Administered 2019-11-10 – 2019-11-14 (×7): 200 mg via ORAL
  Filled 2019-11-09 (×7): qty 2

## 2019-11-09 MED ORDER — SODIUM CHLORIDE 0.9 % IV SOLN
500.0000 mg | Freq: Once | INTRAVENOUS | Status: AC
  Administered 2019-11-09: 500 mg via INTRAVENOUS
  Filled 2019-11-09: qty 500

## 2019-11-09 NOTE — H&P (Addendum)
History and Physical    Angel Campbell EPP:[id number] DOB: [birth year] DOA: 11/17/2019  PCP: Marygrace Drought, MD   Patient coming from: Home  I have personally briefly reviewed patient's old medical records in [place]  Chief Complaint: Shortness of breath  HPI: Angel Campbell is a 49 y.o. 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  She admits to having nausea and vomiting but denies having any diarrhea. She denies having any chest pain, diaphoresis, palpitations or any urinary symptoms. Patient is unvaccinated and has had exposure to someone with COVID-19 infection She took her Covid self test at home on 11/08/19 which was positive Upon arrival to the ER she is noted to have room air pulse oximetry of 64% and is currently on a nonrebreather mask at 15 L.  She was initially placed on 6 L with pulse oximetry between 85 to 90%. Arterial blood gas pH 7.4, PCO2 46, PO2 37 O2 sat 71 Sodium 132, potassium 3.3, chloride 96, bicarb 24, glucose 325, BUN 8, creatinine 0.72, alk phos 69, AST 66, ALT 45, LDH 485, ferritin 742, white count 3.5, hemoglobin 12.9, hematocrit 36.7, MCV 84.8, RDW 13.6, fibrinogen 551, fibrin derivatives 1359. Chest x-ray reviewed by me shows patchy bilateral airspace infiltrates consistent with multifocal pneumonia. Twelve-lead EKG reviewed by me shows sinus rhythm with PACs    ED Course:  Patient is a 49 year old female with a history of morbid obesity, venous thromboembolic disease and COPD who presents to the ER for evaluation of worsening shortness of breath.  She is unvaccinated and was exposed to someone with  COVID-19 viral infection.  She had a home test on 11/08/19 which was positive  and her PCR test here in the ER is positive as well.  Upon arrival to the ER she had room air pulse oximetry of 64% and is currently on a nonrebreather mask and nasal cannula to maintain pulse oximetry greater than 92%.  Chest x-ray is consistent with Covid pneumonia.  She received a dose of Rocephin IV, Zithromax IV and 125 mg of Solu-Medrol in the ER.  She will be admitted to the hospital for further evaluation.  Review of Systems: As per HPI otherwise 10 point review of systems negative.    Past Medical History:  Diagnosis Date  . COPD (chronic obstructive pulmonary disease) ([place])   . 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 Chronic coumadin.  . Pulmonary embolism ([place])   . Vertigo     Past Surgical History:  Procedure Laterality Date  . arm surgery    . CESAREAN SECTION    . CHOLECYSTECTOMY    . TOE SURGERY  May 2015     reports that she has quit smoking. She has never used smokeless tobacco. She reports that she does not drink alcohol and does not use drugs.  Allergies  Allergen Reactions  . Sodium Hypochlorite Shortness Of Breath  . Tape Other (See Comments)    Skin Irritation    Family History  Problem Relation Age of Onset  . Diabetes Mother   . Stroke Mother   . Arrhythmia Mother   . AAA (abdominal aortic aneurysm) Father      Prior to Admission medications   Medication Sig Start Date End Date  Taking? Authorizing Provider  warfarin (COUMADIN) 10 MG tablet TAKE 1/2 TABLET DAILY EXCEPT 1 TABLET ON MONDAY, WEDNESDAY AND FRIDAY OR AS DIRECTED BY COUMADIN CLINIC 08/23/19  Yes Wellington Hampshire, MD  albuterol (VENTOLIN HFA) 108 (90 Base) MCG/ACT inhaler Inhale 2 puffs into the lungs every 6 (six) hours as needed for wheezing or shortness of breath. 12/12/18   Duanne Guess, PA-C  benzonatate (TESSALON PERLES) 100 MG capsule Take 1 capsule (100 mg total) by mouth every 6 (six) hours as needed for cough. 12/12/18 12/12/19  Duanne Guess, PA-C  docusate sodium  (COLACE) 100 MG capsule Take 1 tablet once or twice daily as needed for constipation while taking narcotic pain medicine 06/10/19   Hinda Kehr, MD  HYDROcodone-acetaminophen (NORCO/VICODIN) 5-325 MG tablet Take 2 tablets by mouth every 6 (six) hours as needed for moderate pain or severe pain. 06/10/19   Hinda Kehr, MD  predniSONE (DELTASONE) 10 MG tablet Take 1 tablet (10 mg total) by mouth daily. 6,5,4,3,2,1 six day taper 12/12/18   Duanne Guess, [place]    Physical Exam: Vitals:   11/18/2019 1153 10/26/2019 1154 11/11/2019 1239 11/01/2019 1300  BP:    92/60  Pulse: 85  75 72  Resp: 20  (!) 24 15  Temp: 99.3 F (37.4 C)     TempSrc: Oral     SpO2: (!) 64%  94% 95%  Weight:  136.1 kg    Height:  _0  (1.753 m)       Vitals:   10/29/2019 1153 11/20/2019 1154 11/06/2019 1239 11/08/2019 1300  BP:    92/60  Pulse: 85  75 72  Resp: 20  (!) 24 15  Temp: 99.3 F (37.4 C)     TempSrc: Oral     SpO2: (!) 64%  94% 95%  Weight:  136.1 kg    Height:  _1  (1.753 m)      Constitutional: NAD, alert and oriented x 3.  Acutely ill-appearing Eyes: PERRL, lids and conjunctivae normal ENMT: Mucous membranes are moist.  Neck: normal, supple, no masses, no thyromegaly Respiratory: Bilateral air entry, no wheezing, no crackles. Normal respiratory effort. No accessory muscle use.  Cardiovascular: Regular rate and rhythm, no murmurs / rubs / gallops. No extremity edema. 2+ pedal pulses. No carotid bruits.  Abdomen: no tenderness, no masses palpated. No hepatosplenomegaly. Bowel sounds positive.  Central adiposity Musculoskeletal: no clubbing / cyanosis. No joint deformity upper and lower extremities.  Skin: Hyperpigmented areas on lower extremities Neurologic: No gross focal neurologic deficit.   Psychiatric: Normal mood and affect.   Labs on Admission: I have personally reviewed following labs and imaging studies  CBC: Recent Labs  Lab 11/13/2019 1208  WBC 3.5*  NEUTROABS 2.7  HGB 12.9  HCT 36.7   MCV 84.8  PLT 944   Basic Metabolic Panel: Recent Labs  Lab 11/10/2019 1208  NA 132*  K 3.3*  CL 96*  CO2 24  GLUCOSE 325*  BUN 8  CREATININE 0.72  CALCIUM 7.8*   GFR: Estimated Creatinine Clearance: 123.7 mL/min (by C-G formula based on SCr of 0.72 mg/dL). Liver Function Tests: Recent Labs  Lab 10/26/2019 1208  AST 66*  ALT 45*  ALKPHOS 69  BILITOT 0.8  PROT 7.1  ALBUMIN 3.2*   No results for input(s): LIPASE, AMYLASE in the last 168 hours. No results for input(s): AMMONIA in the last 168 hours. Coagulation Profile: Recent Labs  Lab 11/18/2019 1208  INR 1.6*   Cardiac  Enzymes: No results for input(s): CKTOTAL, CKMB, CKMBINDEX, TROPONINI in the last 168 hours. BNP (last 3 results) No results for input(s): PROBNP in the last 8760 hours. HbA1C: No results for input(s): HGBA1C in the last 72 hours. CBG: No results for input(s): GLUCAP in the last 168 hours. Lipid Profile: Recent Labs    11/21/2019 1208  TRIG 79   Thyroid Function Tests: No results for input(s): TSH, T4TOTAL, FREET4, T3FREE, THYROIDAB in the last 72 hours. Anemia Panel: Recent Labs    11/18/2019 1208  FERRITIN 742*   Urine analysis:    Component Value Date/Time   COLORURINE YELLOW (A) 06/10/2019 0236   APPEARANCEUR CLOUDY (A) 06/10/2019 0236   LABSPEC 1.029 06/10/2019 0236   PHURINE 5.0 06/10/2019 0236   GLUCOSEU >=500 (A) 06/10/2019 0236   HGBUR NEGATIVE 06/10/2019 0236   BILIRUBINUR NEGATIVE 06/10/2019 0236   KETONESUR 5 (A) 06/10/2019 0236   PROTEINUR 30 (A) 06/10/2019 0236   NITRITE NEGATIVE 06/10/2019 0236   LEUKOCYTESUR TRACE (A) 06/10/2019 0236    Radiological Exams on Admission: DG Chest Port 1 View  Result Date: 11/12/2019 CLINICAL DATA:  Shortness of, hypoxia, COVID-19 positive on home test, COPD EXAM: PORTABLE CHEST 1 VIEW COMPARISON:  Portable exam 1211 hours compared to 03/29/2013 FINDINGS: Upper normal heart size. Mediastinal contours normal. Patchy BILATERAL airspace  infiltrates consistent with multifocal pneumonia. No pleural effusion, pneumothorax or acute osseous findings. IMPRESSION: Patchy BILATERAL airspace infiltrates consistent with multifocal pneumonia. Electronically Signed   By: Lavonia Dana M.D.   On: 11/19/2019 12:18    EKG: Independently reviewed.  Sinus rhythm PACs  Assessment/Plan Principal Problem:   Pneumonia due to COVID-19 virus Active Problems:   DVT, bilateral lower limbs (HCC)   Obesity, Class III, BMI 40-49.9 (morbid obesity) ([place])   Acute respiratory failure due to COVID-19 [facility])   COPD (chronic obstructive pulmonary disease) (HCC)   Hypokalemia   Pneumonia due to COVID-19 virus with acute respiratory failure Patient presents to the ER for evaluation of worsening shortness of breath, myalgias and a cough and was found to have room air pulse oximetry of 64%. She was initially tried on 6 L of oxygen via nasal cannula with pulse oximetry in the high 80s and she is currently on a nonrebreather mask and nasal cannula maintaining pulse oximetry greater than 92% She is unvaccinated and was exposed to someone with COVID-19 viral infection COVID-19 PCR test positive on 09/15 We will place patient on IV Solu-Medrol We will place patient on IV remdesivir per protocol Supportive care with bronchodilator therapy and antitussives   Hyperglycemia Patient with no known history of diabetes mellitus but with blood sugar of 325 on her chemistry We will place patient on insulin since systemic steroids will increase her risk for worsening hyperglycemia   Hypokalemia Supplement potassium   Morbid obesity (BMI 44) Complicates overall prognosis and care   History of venous thromboembolic disease Patient has a history of DVT and PE and is on chronic anticoagulation therapy with Coumadin. INR is subtherapeutic Patient presented with hypoxia, room air pulse oximetry at rest of 64% which could be related to COVID-19 pneumonia but will  obtain CT angiogram of the chest to rule out PE Obtain daily PT/INR and will discontinue Lovenox once INR is greater than 2   COPD Stable and not in acute exacerbation     DVT prophylaxis: Lovenox Code Status: Full code Family Communication: Greater than 50% of time was spent discussing plan of care with patient at the  bedside.  She verbalized understanding and agrees with the plan.  All questions and concerns have been addressed. Disposition Plan: Back to previous home environment Consults called: None    Jilliam Bellmore MD [facility]     11/17/2019, 1:45 PM

## 2019-11-09 NOTE — ED Provider Notes (Signed)
[facility] Emergency Department Provider Note  ____________________________________________  Time seen: Approximately 12:01 PM  I have reviewed the triage vital signs and the nursing notes.   HISTORY  Chief Complaint Covid Positive    HPI Angel Campbell is a 49 y.o. female with a history of COPD DVT and PE on Coumadin  who comes ED complaining of 3 days of gradual onset worsening shortness of breath, no chest pain.  Also has some nausea and vomiting but no diarrhea, positive body aches and fatigue.  Symptoms have been constant, waxing and waning, no aggravating or alleviating factors, now severe.  She took a Covid self test at home yesterday which was positive according to the patient.  No documented lab results available in the EMR.     Past Medical History:  Diagnosis Date   COPD (chronic obstructive pulmonary disease) (HCC)    DVT of deep femoral vein (HCC) 2004   a. Chronic coumadin.   Pulmonary embolism [facility])    Vertigo      Patient Active Problem List   Diagnosis Date Noted   Pneumonia due to COVID-19 virus 11/03/2019   Obesity, Class III, BMI 40-49.9 (morbid obesity) (HCC) 11/21/2019   Acute respiratory failure due to COVID-19 [facility]) 11/08/2019   COPD (chronic obstructive pulmonary disease) (HCC)    Hypokalemia    Encounter for monitoring Coumadin therapy 02/10/2017   DVT, bilateral lower limbs (HCC) 06/07/2015   Pulmonary emboli (HCC) 06/07/2015   Thyroid nodule 06/07/2015     Past Surgical History:  Procedure Laterality Date   arm surgery     CESAREAN SECTION     CHOLECYSTECTOMY     TOE SURGERY  May 2015     Prior to Admission medications   Medication Sig Start Date End Date Taking? Authorizing Provider  warfarin (COUMADIN) 10 MG tablet TAKE 1/2 TABLET DAILY EXCEPT 1 TABLET ON MONDAY, WEDNESDAY AND FRIDAY OR AS DIRECTED BY COUMADIN CLINIC 08/23/19  Yes Iran Ouch, MD  albuterol (VENTOLIN HFA) 108 (90  Base) MCG/ACT inhaler Inhale 2 puffs into the lungs every 6 (six) hours as needed for wheezing or shortness of breath. 12/12/18   Evon Slack, PA-C  benzonatate (TESSALON PERLES) 100 MG capsule Take 1 capsule (100 mg total) by mouth every 6 (six) hours as needed for cough. 12/12/18 12/12/19  Evon Slack, PA-C  docusate sodium (COLACE) 100 MG capsule Take 1 tablet once or twice daily as needed for constipation while taking narcotic pain medicine 06/10/19   Loleta Rose, MD  HYDROcodone-acetaminophen (NORCO/VICODIN) 5-325 MG tablet Take 2 tablets by mouth every 6 (six) hours as needed for moderate pain or severe pain. 06/10/19   Loleta Rose, MD  predniSONE (DELTASONE) 10 MG tablet Take 1 tablet (10 mg total) by mouth daily. 6,5,4,3,2,1 six day taper 12/12/18   Evon Slack, PA-C     Allergies Sodium hypochlorite and Tape   Family History  Problem Relation Age of Onset   Diabetes Mother    Stroke Mother    Arrhythmia Mother    AAA (abdominal aortic aneurysm) Father     Social History Social History   Tobacco Use   Smoking status: Former Smoker   Smokeless tobacco: Never Used  Building services engineer Use: Never used  Substance Use Topics   Alcohol use: No   Drug use: No    Review of Systems  Constitutional:   No fever positive chills.  ENT:   No sore throat. No  rhinorrhea. Cardiovascular:   No chest pain or syncope. Respiratory: Positive shortness of breath and nonproductive cough. Gastrointestinal:   Negative for abdominal pain, positive vomiting.  Musculoskeletal:   Negative for focal pain or swelling All other systems reviewed and are negative except as documented above in ROS and HPI.  ____________________________________________   PHYSICAL EXAM:  VITAL SIGNS: ED Triage Vitals  Enc Vitals Group     BP --      Pulse Rate 2019/06/02 1153 85     Resp 2019/06/02 1153 20     Temp 2019/06/02 1153 99.3 F (37.4 C)     Temp Source 2019/06/02 1153 Oral      SpO2 2019/06/02 1152 (!) 64 %     Weight 2019/06/02 1154 300 lb (136.1 kg)     Height 2019/06/02 1154 5\' 9"  (1.753 m)     Head Circumference --      Peak Flow --      Pain Score 2019/06/02 1154 0     Pain Loc --      Pain Edu? --      Excl. in GC? --     Vital signs reviewed, nursing assessments reviewed.   Constitutional:   Alert and oriented.  Mild respiratory distress Eyes:   Conjunctivae are normal. EOMI. PERRL. ENT      Head:   Normocephalic and atraumatic.      Nose:   Wearing a mask.      Mouth/Throat:   Wearing a mask.      Neck:   No meningismus. Full ROM. Hematological/Lymphatic/Immunilogical:   No cervical lymphadenopathy. Cardiovascular:   RRR. Symmetric bilateral radial and DP pulses.  No murmurs. Cap refill less than 2 seconds. Respiratory:   Normal respiratory effort without tachypnea/retractions.  Bilateral midlung and basilar crackles Gastrointestinal:   Soft and nontender. Non distended. There is no CVA tenderness.  No rebound, rigidity, or guarding.  Musculoskeletal:   Normal range of motion in all extremities. No joint effusions.  No lower extremity tenderness.  No edema.  Symmetric calf circumference Neurologic:   Normal speech and language.  Motor grossly intact. No acute focal neurologic deficits are appreciated.  Skin:    Skin is warm, dry and intact. No rash noted.  No petechiae, purpura, or bullae.  ____________________________________________    LABS (pertinent positives/negatives) (all labs ordered are listed, but only abnormal results are displayed) Labs Reviewed  SARS CORONAVIRUS 2 BY RT PCR (HOSPITAL ORDER, PERFORMED IN [facility]) - Abnormal; Notable for the following components:      Result Value   SARS Coronavirus 2 POSITIVE (*)    All other components within normal limits  CBC WITH DIFFERENTIAL/PLATELET - Abnormal; Notable for the following components:   WBC 3.5 (*)    Lymphs Abs 0.6 (*)    All other components within normal  limits  COMPREHENSIVE METABOLIC PANEL - Abnormal; Notable for the following components:   Sodium 132 (*)    Potassium 3.3 (*)    Chloride 96 (*)    Glucose, Bld 325 (*)    Calcium 7.8 (*)    Albumin 3.2 (*)    AST 66 (*)    ALT 45 (*)    All other components within normal limits  FIBRIN DERIVATIVES D-DIMER ([facility] ONLY) - Abnormal; Notable for the following components:   Fibrin derivatives D-dimer ([facility]) 1,359.44 (*)    All other components within normal limits  LACTATE DEHYDROGENASE - Abnormal; Notable for the following components:  LDH 485 (*)    All other components within normal limits  FERRITIN - Abnormal; Notable for the following components:   Ferritin 742 (*)    All other components within normal limits  FIBRINOGEN - Abnormal; Notable for the following components:   Fibrinogen 551 (*)    All other components within normal limits  PROTIME-INR - Abnormal; Notable for the following components:   Prothrombin Time 18.6 (*)    INR 1.6 (*)    All other components within normal limits  BLOOD GAS, VENOUS - Abnormal; Notable for the following components:   Bicarbonate 29.2 (*)    Acid-Base Excess 3.8 (*)    All other components within normal limits  PROCALCITONIN  TRIGLYCERIDES  C-REACTIVE PROTEIN   ____________________________________________   EKG  Interpreted by me Sinus rhythm rate of 87, normal axis and intervals.  Normal QRS ST segments and T waves.  ____________________________________________    RADIOLOGY  DG Chest Port 1 View  Result Date: 11/07/2019 CLINICAL DATA:  Shortness of, hypoxia, COVID-19 positive on home test, COPD EXAM: PORTABLE CHEST 1 VIEW COMPARISON:  Portable exam 1211 hours compared to 03/29/2013 FINDINGS: Upper normal heart size. Mediastinal contours normal. Patchy BILATERAL airspace infiltrates consistent with multifocal pneumonia. No pleural effusion, pneumothorax or acute osseous findings. IMPRESSION: Patchy BILATERAL airspace infiltrates  consistent with multifocal pneumonia. Electronically Signed   By: Ulyses Southward M.D.   On: 10/30/2019 12:18    ____________________________________________   PROCEDURES .Critical Care Performed by: Sharman Cheek, MD Authorized by: Sharman Cheek, MD   Critical care provider statement:    Critical care time (minutes):  35   Critical care time was exclusive of:  Separately billable procedures and treating other patients   Critical care was necessary to treat or prevent imminent or life-threatening deterioration of the following conditions:  Respiratory failure   Critical care was time spent personally by me on the following activities:  Development of treatment plan with patient or surrogate, discussions with consultants, evaluation of patient's response to treatment, examination of patient, obtaining history from patient or surrogate, ordering and performing treatments and interventions, ordering and review of laboratory studies, ordering and review of radiographic studies, pulse oximetry, re-evaluation of patient's condition and review of old charts    ____________________________________________    CLINICAL IMPRESSION / ASSESSMENT AND PLAN / ED COURSE  Medications ordered in the ED: Medications  azithromycin (ZITHROMAX) 500 mg in sodium chloride 0.9 % 250 mL IVPB (500 mg Intravenous New Bag/Given 11/14/2019 1341)  methylPREDNISolone sodium succinate (SOLU-MEDROL) 125 mg/2 mL injection 125 mg (125 mg Intravenous Given 11/17/2019 1237)  cefTRIAXone (ROCEPHIN) 2 g in sodium chloride 0.9 % 100 mL IVPB (2 g Intravenous New Bag/Given 11/08/2019 1329)    Pertinent labs & imaging results that were available during my care of the patient were reviewed by me and considered in my medical decision making (see chart for details).  Angel Campbell was evaluated in Emergency Department on 11/08/2019 for the symptoms described in the history of present illness. She was evaluated in the context of the  global COVID-19 pandemic, which necessitated consideration that the patient might be at risk for infection with the SARS-CoV-2 virus that causes COVID-19. Institutional protocols and algorithms that pertain to the evaluation of patients at risk for COVID-19 are in a state of rapid change based on information released by regulatory bodies including the CDC and federal and state organizations. These policies and algorithms were followed during the patient's care in the ED.  Patient presents with reported positive home Covid test, acute hypoxic respiratory failure.  Most likely Covid pneumonia and pneumonitis resulting in hypoxia, but with comorbidities including COPD, will give a dose of ceftriaxone and azithromycin for now, pending confirmatory Covid PCR.  Will give Solu-Medrol, check labs and chest x-ray.  Will need to admit for respiratory failure.  Start high flow nasal cannula, as oxygen saturation remains 85 to 90% on 6 L nasal cannula.  Patient is already on warfarin, she is not tachycardic, I doubt acute PE.  Will check coags.  Patient is not septic, cultures and lactate not indicated.     ____________________________________________   FINAL CLINICAL IMPRESSION(S) / ED DIAGNOSES    Final diagnoses:  Pneumonia due to COVID-19 virus  Acute respiratory failure with hypoxia (HCC)  Chronic obstructive pulmonary disease, unspecified COPD type (HCC)  Morbid obesity [facility])     ED Discharge Orders    None      Portions of this note were generated with dragon dictation software. Dictation errors may occur despite best attempts at proofreading.   Sharman Cheek, MD 11-17-19 [phone]

## 2019-11-09 NOTE — ED Notes (Signed)
Responded to patient's monitor alarm.  patietn dozing.  On 100% NRB.  Sats 82-85%.  Woke patient asked her to take deep breaths.  Sats slowly improved to 92%.  Patient again dozing off, sats returned to 85-86%.  Dr. Joylene Igo alerted.  Paitent's skin remains warm and dry.  Respirations regular and non labored.  HOB elevated 45 degrees.   Continue to monitor.

## 2019-11-09 NOTE — Progress Notes (Signed)
PHARMACY NOTE:  ANTIMICROBIAL RENAL DOSAGE ADJUSTMENT  Current antimicrobial regimen includes a mismatch between antimicrobial dosage and estimated renal function.  As per policy approved by the Pharmacy & Therapeutics and Medical Executive Committees, the antimicrobial dosage will be adjusted accordingly.  Current antimicrobial dosage:  Baricitinib 2mg  qd  Indication: COVID PNA  Renal Function:  GFR > 60  Estimated Creatinine Clearance: 123.7 mL/min (by C-G formula based on SCr of 0.72 mg/dL). []      On intermittent HD, scheduled: []      On CRRT    Antimicrobial dosage changed to: baricitinib 4mg  qd  Additional comments:   Thank you for allowing pharmacy to be a part of this patient's care.  , PharmD, BCPS Clinical Pharmacist 11/21/2019 2:59 PM

## 2019-11-09 NOTE — ED Notes (Signed)
Patient transported to CT 

## 2019-11-09 NOTE — ED Notes (Signed)
RT paged to place patient on hi flo oxygen vs Bipap.

## 2019-11-09 NOTE — ED Triage Notes (Signed)
Pt in via POV, reports positive self Covid test yesterday, presenting today with worsening shortness of breath.  Pt saturation 64% on room air.

## 2019-11-10 LAB — CBC WITH DIFFERENTIAL/PLATELET
Abs Immature Granulocytes: 0.03 10*3/uL (ref 0.00–0.07)
Basophils Absolute: 0 10*3/uL (ref 0.0–0.1)
Basophils Relative: 0 %
Eosinophils Absolute: 0 10*3/uL (ref 0.0–0.5)
Eosinophils Relative: 0 %
HCT: 29 % — ABNORMAL LOW (ref 36.0–46.0)
Hemoglobin: 9.6 g/dL — ABNORMAL LOW (ref 12.0–15.0)
Immature Granulocytes: 1 %
Lymphocytes Relative: 18 %
Lymphs Abs: 0.7 10*3/uL (ref 0.7–4.0)
MCH: 29.4 pg (ref 26.0–34.0)
MCHC: 33.1 g/dL (ref 30.0–36.0)
MCV: 88.7 fL (ref 80.0–100.0)
Monocytes Absolute: 0.1 10*3/uL (ref 0.1–1.0)
Monocytes Relative: 3 %
Neutro Abs: 3.1 10*3/uL (ref 1.7–7.7)
Neutrophils Relative %: 78 %
Platelets: 263 10*3/uL (ref 150–400)
RBC: 3.27 MIL/uL — ABNORMAL LOW (ref 3.87–5.11)
RDW: 13.8 % (ref 11.5–15.5)
WBC: 3.9 10*3/uL — ABNORMAL LOW (ref 4.0–10.5)
nRBC: 0 % (ref 0.0–0.2)

## 2019-11-10 LAB — GLUCOSE, CAPILLARY
Glucose-Capillary: 238 mg/dL — ABNORMAL HIGH (ref 70–99)
Glucose-Capillary: 301 mg/dL — ABNORMAL HIGH (ref 70–99)
Glucose-Capillary: 317 mg/dL — ABNORMAL HIGH (ref 70–99)
Glucose-Capillary: 235 mg/dL — ABNORMAL HIGH (ref 70–99)

## 2019-11-10 LAB — PHOSPHORUS: Phosphorus: 2.9 mg/dL (ref 2.5–4.6)

## 2019-11-10 LAB — COMPREHENSIVE METABOLIC PANEL
ALT: 40 U/L (ref 0–44)
AST: 60 U/L — ABNORMAL HIGH (ref 15–41)
Albumin: 3 g/dL — ABNORMAL LOW (ref 3.5–5.0)
Alkaline Phosphatase: 68 U/L (ref 38–126)
Anion gap: 10 (ref 5–15)
BUN: 15 mg/dL (ref 6–20)
CO2: 25 mmol/L (ref 22–32)
Calcium: 7.9 mg/dL — ABNORMAL LOW (ref 8.9–10.3)
Chloride: 103 mmol/L (ref 98–111)
Creatinine, Ser: 0.59 mg/dL (ref 0.44–1.00)
GFR calc Af Amer: >60 mL/min (ref >60)
GFR calc non Af Amer: >60 mL/min (ref >60)
Glucose, Bld: 282 mg/dL — ABNORMAL HIGH (ref 70–99)
Potassium: 4 mmol/L (ref 3.5–5.1)
Sodium: 138 mmol/L (ref 135–145)
Total Bilirubin: 0.7 mg/dL (ref 0.3–1.2)
Total Protein: 7 g/dL (ref 6.5–8.1)

## 2019-11-10 LAB — HEMOGLOBIN A1C
Hgb A1c MFr Bld: 10 % — ABNORMAL HIGH (ref 4.8–5.6)
Mean Plasma Glucose: 240.3 mg/dL

## 2019-11-10 LAB — PROTIME-INR
INR: 1.8 — ABNORMAL HIGH (ref 0.8–1.2)
Prothrombin Time: 19.9 seconds — ABNORMAL HIGH (ref 11.4–15.2)

## 2019-11-10 LAB — FIBRIN DERIVATIVES D-DIMER (ARMC ONLY): Fibrin derivatives D-dimer (ARMC): 1122.27 ng/mL (FEU) — ABNORMAL HIGH (ref 0.00–499.00)

## 2019-11-10 LAB — MAGNESIUM: Magnesium: 2.4 mg/dL (ref 1.7–2.4)

## 2019-11-10 LAB — FERRITIN: Ferritin: 871 ng/mL — ABNORMAL HIGH (ref 11–307)

## 2019-11-10 LAB — C-REACTIVE PROTEIN: CRP: 10.4 mg/dL — ABNORMAL HIGH (ref <1.0)

## 2019-11-10 MED ORDER — INSULIN ASPART 100 UNIT/ML ~~LOC~~ SOLN
0.0000 [IU] | Freq: Three times a day (TID) | SUBCUTANEOUS | Status: DC
  Administered 2019-11-10 (×2): 11 [IU] via SUBCUTANEOUS
  Administered 2019-11-11: 5 [IU] via SUBCUTANEOUS
  Administered 2019-11-11 – 2019-11-12 (×5): 8 [IU] via SUBCUTANEOUS
  Filled 2019-11-10 (×8): qty 1

## 2019-11-10 MED ORDER — INSULIN ASPART 100 UNIT/ML ~~LOC~~ SOLN
4.0000 [IU] | Freq: Three times a day (TID) | SUBCUTANEOUS | Status: DC
  Administered 2019-11-10 – 2019-11-15 (×13): 4 [IU] via SUBCUTANEOUS
  Filled 2019-11-10 (×14): qty 1

## 2019-11-10 MED ORDER — INSULIN DETEMIR 100 UNIT/ML ~~LOC~~ SOLN
25.0000 [IU] | Freq: Every day | SUBCUTANEOUS | Status: DC
  Filled 2019-11-10: qty 0.25

## 2019-11-10 MED ORDER — LORAZEPAM 2 MG/ML IJ SOLN
0.5000 mg | INTRAMUSCULAR | Status: DC | PRN
  Administered 2019-11-10 – 2019-11-15 (×3): 0.5 mg via INTRAVENOUS
  Filled 2019-11-10 (×3): qty 1

## 2019-11-10 MED ORDER — INSULIN ASPART 100 UNIT/ML ~~LOC~~ SOLN
0.0000 [IU] | Freq: Every day | SUBCUTANEOUS | Status: DC
  Administered 2019-11-10: 2 [IU] via SUBCUTANEOUS
  Administered 2019-11-12: 3 [IU] via SUBCUTANEOUS
  Filled 2019-11-10 (×2): qty 1

## 2019-11-10 NOTE — ED Notes (Signed)
This RN attempted to call report to floor 2A at this time. Per Diplomatic Services operational officer, RN not available at this time. Gave ascom number for call back. Will inform charge Marinell Sours.

## 2019-11-10 NOTE — ED Notes (Signed)
Pt given meal tray.

## 2019-11-10 NOTE — ED Notes (Signed)
Pt continues to take nonrebreather off for periods of time. RN educated pt on purpose of nonrebreather and instructed pt to leave it on. Pt states she understands.

## 2019-11-10 NOTE — ED Notes (Signed)
Pt given meal tray at this time 

## 2019-11-10 NOTE — ED Notes (Signed)
RT at bedside.

## 2019-11-10 NOTE — Consult Note (Signed)
ANTICOAGULATION CONSULT NOTE  Pharmacy Consult for Warfarin Indication: History of VTE  Patient Measurements: Height: 5\' 9"  (175.3 cm) Weight: 136.1 kg (300 lb) IBW/kg (Calculated) : 66.2  Vital Signs: BP: 127/65 (09/16 1415) Pulse Rate: 76 (09/16 1415)  Labs: Recent Labs    11-Nov-2019 1208 11/10/19 0342  HGB 12.9 9.6*  HCT 36.7 29.0*  PLT 183 263  LABPROT 18.6* 19.9*  INR 1.6* 1.8*  CREATININE 0.72 0.59    Estimated Creatinine Clearance: 123.7 mL/min (by C-G formula based on SCr of 0.59 mg/dL).   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 Pharmacy has been consulted to manage warfarin while inpatient.   Date INR Plan  9/14 Unk Last home dose  9/15 1.5 Not resumed  9/16 1.8 Hold    Goal of Therapy:  INR 2-3   Plan:  --Hgb 12.9 >> 9.6 overnight. Platelets actually increased. No apparent signs/symptoms of bleeding. Discussed with MD, will hold off on reinitiating warfarin tonight and follow-up CBC tomorrow  10/16 11/10/2019,2:32 PM

## 2019-11-10 NOTE — Progress Notes (Addendum)
Inpatient Diabetes Program Recommendations  AACE/ADA: New Consensus Statement on Inpatient Glycemic Control (2015)  Target Ranges:  Prepandial:   less than 140 mg/dL      Peak postprandial:   less than 180 mg/dL (1-2 hours)      Critically ill patients:  140 - 180 mg/dL   Lab Results  Component Value Date   GLUCAP 238 (H) 11/10/2019   HGBA1C 6.4 (H) 07/29/2013    Review of Glycemic Control Results for CHANELL, NADEAU (MRN [id number]) as of 11/10/2019 11:14  Ref. Range 11/07/2019 18:16 10/30/2019 23:47 11/10/2019 09:04  Glucose-Capillary Latest Ref Range: 70 - 99 mg/dL 433 (H) 295 (H) 188 (H)   Diabetes history: No hx DM Outpatient Diabetes medications: None Current orders for Inpatient glycemic control: Levemir 20 units bid + Novolog resistant correction tid + Solumedrol 140 mg bid  Inpatient Diabetes Program Recommendations:   -Increase Levemir to 25 units bid -Add Novolog 5 units tid meal coverage if eats 50% -Add hs Novolog correction scale 0-5 units Secure chat sent to Dr. Hughie Closs with recommendations @ 11:16.  Thank you, Angel Campbell. Uzziah Rigg, RN, MSN, CDE  Diabetes Coordinator Inpatient Glycemic Control Team Team Pager [phone] (8am-5pm) 11/10/2019 11:16 AM

## 2019-11-10 NOTE — Progress Notes (Signed)
PROGRESS NOTE    Angel Campbell  OVZ:[id number] DOB: [birth year] DOA: 11/22/2019 PCP: Marygrace Drought, MD   Brief Narrative:  HPI: Angel Campbell is a 49 y.o. 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  She admits to having nausea and vomiting but denies having any diarrhea. She denies having any chest pain, diaphoresis, palpitations or any urinary symptoms. Patient is unvaccinated and has had exposure to someone with COVID-19 infection She took her Covid self test at home on 11/08/19 which was positive Upon arrival to the ER she is noted to have room air pulse oximetry of 64% and is currently on a nonrebreather mask at 15 L.  She was initially placed on 6 L with pulse oximetry between 85 to 90%. Arterial blood gas pH 7.4, PCO2 46, PO2 37 O2 sat 71 Sodium 132, potassium 3.3, chloride 96, bicarb 24, glucose 325, BUN 8, creatinine 0.72, alk phos 69, AST 66, ALT 45, LDH 485, ferritin 742, white count 3.5, hemoglobin 12.9, hematocrit 36.7, MCV 84.8, RDW 13.6, fibrinogen 551, fibrin derivatives 1359. Chest x-ray reviewed by me shows patchy bilateral airspace infiltrates consistent with multifocal pneumonia. Twelve-lead EKG reviewed by me shows sinus rhythm with PACs    ED Course:  Patient is a 49 year old female with a history of morbid obesity, venous thromboembolic disease and COPD who presents to the ER for evaluation of worsening shortness of breath.  She is unvaccinated and was exposed to someone with  COVID-19 viral infection.  She had a home test on 11/08/19 which was positive and her PCR test here in the ER is positive as well.  Upon arrival to the ER she had room air pulse oximetry of 64% and is currently on a  nonrebreather mask and nasal cannula to maintain pulse oximetry greater than 92%.  Chest x-ray is consistent with Covid pneumonia.  She received a dose of Rocephin IV, Zithromax IV and 125 mg of Solu-Medrol in the ER.  She will be admitted to the hospital for further evaluation.  Assessment & Plan:   Principal Problem:   Pneumonia due to COVID-19 virus Active Problems:   DVT, bilateral lower limbs (HCC)   Obesity, Class III, BMI 40-49.9 (morbid obesity) (HCC)   Acute respiratory failure due to COVID-19 [facility])   COPD (chronic obstructive pulmonary disease) (HCC)   Hypokalemia  Acute hypoxic respiratory failure secondary to COVID-19 pneumonia: Patient was found to have room air pulse oximetry of 64%. She was initially tried on 6 L of oxygen via nasal cannula with pulse oximetry in the high 80s so she was placed on a nonrebreather mask and nasal cannula maintaining pulse oximetry greater than 92% She is unvaccinated and was exposed to someone with COVID-19 viral infection COVID-19 PCR test positive on 09/15 Continue following IV Solu-Medrol Remdesivir per protocol Baricitinib, this was initiated by admitting hospitalist.  patient denies any known history of active diverticulitis, tuberculosis or hepatitis, understands the risks and benefits and wants to continue with Actemra/barititinib treatment if required. Supportive care with bronchodilator therapy and antitussives.  Zinc and vitamin C. Patient was encouraged to prone, out of bed to chair, to use incentive spirometry and flutter valve. The treatment plan and use of medications and known side effects were discussed with patient/family, they were clearly  explained that there is no proven definitive treatment for COVID-19 infection, any medications used here are based on published clinical articles/anecdotal data which are not peer-reviewed or randomized control trials.  Complete risks and long-term side effects are unknown, however in the best  clinical judgment they seem to be of some clinical benefit rather than medical risks.  Patient/family agree with the treatment plan and want to receive the given medications.  Hyperglycemia: Patient with no known history of diabetes mellitus but with blood sugar of 325 on her chemistry Remains hyperglycemic.  Check hemoglobin A1c.  Continue SSI.  Hypokalemia: Resolved  Morbid obesity (BMI 44) Complicates overall prognosis and care.  Weight loss encouraged.  History of venous thromboembolic disease Patient has a history of DVT and PE and is on chronic anticoagulation therapy with Coumadin. INR is subtherapeutic.  Coumadin, dosed by pharmacy.  Continue DVT prophylaxis dose of Lovenox until INR becomes therapeutic.  COPD: Stable and not in acute exacerbation  DVT prophylaxis: enoxaparin (LOVENOX) injection 40 mg Start: 10/28/2019 2200   Code Status: Full Code  Family Communication:  None present at bedside.  Plan of care discussed with patient in length and he verbalized understanding and agreed with it.  Status is: Inpatient  Remains inpatient appropriate because:Hemodynamically unstable   Dispo: The patient is from: Home              Anticipated d/c is to: Home              Anticipated d/c date is: > 3 days              Patient currently is not medically stable to d/c.        Estimated body mass index is 44.3 kg/m as calculated from the following:   Height as of this encounter: 5' 9"  (1.753 m).   Weight as of this encounter: 136.1 kg.      Nutritional status:               Consultants:   None  Procedures:   None  Antimicrobials:  Anti-infectives (From admission, onward)   Start     Dose/Rate Route Frequency Ordered Stop   11/10/19 1000  remdesivir 100 mg in sodium chloride 0.9 % 100 mL IVPB       "Followed by" Linked Group Details   100 mg 200 mL/hr over 30 Minutes Intravenous Daily 11/02/2019 1426 11/14/19 0959   11/14/2019 1500  remdesivir 200  mg in sodium chloride 0.9% 250 mL IVPB       "Followed by" Linked Group Details   200 mg 580 mL/hr over 30 Minutes Intravenous Once 11/13/2019 1426 11/11/2019 1645   10/30/2019 1215  cefTRIAXone (ROCEPHIN) 2 g in sodium chloride 0.9 % 100 mL IVPB        2 g 200 mL/hr over 30 Minutes Intravenous  Once 11/08/2019 1201 11/02/2019 1408   11/08/2019 1215  azithromycin (ZITHROMAX) 500 mg in sodium chloride 0.9 % 250 mL IVPB        500 mg 250 mL/hr over 60 Minutes Intravenous  Once 11/24/2019 1201 11/18/2019 1447         Subjective: Seen and examined.  She states that her breathing is better than yesterday.  She had no other complaint.  Objective: Vitals:   11/10/19 0145 11/10/19 0445 11/10/19 0735 11/10/19 0752  BP:    135/80  Pulse: 63 64  71  Resp: 19 (!) 25  (!) 27  Temp:  TempSrc:      SpO2: 90% 91% 91% 91%  Weight:      Height:        Intake/Output Summary (Last 24 hours) at 11/10/2019 0811 Last data filed at 11/10/2019 0505 Gross per 24 hour  Intake 650 ml  Output 500 ml  Net 150 ml   Filed Weights   11/11/2019 1154  Weight: 136.1 kg    Examination:  General exam: Appears calm and comfortable, morbidly obese Respiratory system: Diminished breath sounds due to body habitus. Respiratory effort normal. Cardiovascular system: S1 & S2 heard, RRR. No JVD, murmurs, rubs, gallops or clicks.  Trace pitting edema bilateral lower extremity. Gastrointestinal system: Abdomen is nondistended, soft and nontender. No organomegaly or masses felt. Normal bowel sounds heard. Central nervous system: Alert and oriented. No focal neurological deficits. Extremities: Symmetric 5 x 5 power. Skin: No rashes, lesions or ulcers Psychiatry: Judgement and insight appear normal. Mood & affect appropriate.    Data Reviewed: I have personally reviewed following labs and imaging studies  CBC: Recent Labs  Lab 11/24/2019 1208 11/10/19 0342  WBC 3.5* 3.9*  NEUTROABS 2.7 3.1  HGB 12.9 9.6*  HCT 36.7 29.0*    MCV 84.8 88.7  PLT 183 765   Basic Metabolic Panel: Recent Labs  Lab 11/19/2019 1208 11/10/19 0342  NA 132* 138  K 3.3* 4.0  CL 96* 103  CO2 24 25  GLUCOSE 325* 282*  BUN 8 15  CREATININE 0.72 0.59  CALCIUM 7.8* 7.9*  MG  --  2.4  PHOS  --  2.9   GFR: Estimated Creatinine Clearance: 123.7 mL/min (by C-G formula based on SCr of 0.59 mg/dL). Liver Function Tests: Recent Labs  Lab 11/18/2019 1208 11/10/19 0342  AST 66* 60*  ALT 45* 40  ALKPHOS 69 68  BILITOT 0.8 0.7  PROT 7.1 7.0  ALBUMIN 3.2* 3.0*   No results for input(s): LIPASE, AMYLASE in the last 168 hours. No results for input(s): AMMONIA in the last 168 hours. Coagulation Profile: Recent Labs  Lab 10/29/2019 1208 11/10/19 0342  INR 1.6* 1.8*   Cardiac Enzymes: No results for input(s): CKTOTAL, CKMB, CKMBINDEX, TROPONINI in the last 168 hours. BNP (last 3 results) No results for input(s): PROBNP in the last 8760 hours. HbA1C: No results for input(s): HGBA1C in the last 72 hours. CBG: Recent Labs  Lab 11/17/2019 1816 11/08/2019 2347  GLUCAP 339* 319*   Lipid Profile: Recent Labs    11/02/2019 1208  TRIG 79   Thyroid Function Tests: No results for input(s): TSH, T4TOTAL, FREET4, T3FREE, THYROIDAB in the last 72 hours. Anemia Panel: Recent Labs    10/31/2019 1208 11/10/19 0342  FERRITIN 742* 871*   Sepsis Labs: Recent Labs  Lab 11/02/2019 1208  PROCALCITON 0.21    Recent Results (from the past 240 hour(s))  SARS Coronavirus 2 by RT PCR (hospital order, performed in [facility] hospital lab) Nasopharyngeal Nasopharyngeal Swab     Status: Abnormal   Collection Time: 11/17/2019 12:08 PM   Specimen: Nasopharyngeal Swab  Result Value Ref Range Status   SARS Coronavirus 2 POSITIVE (A) NEGATIVE Final    Comment: RESULT CALLED TO, READ BACK BY AND VERIFIED WITH: CHRISTINE HALLAS @1348  11/19/2019 MJU (NOTE) SARS-CoV-2 target nucleic acids are DETECTED  SARS-CoV-2 RNA is generally detectable in upper  respiratory specimens  during the acute phase of infection.  Positive results are indicative  of the presence of the identified virus, but do not rule out bacterial infection or  co-infection with other pathogens not detected by the test.  Clinical correlation with patient history and  other diagnostic information is necessary to determine patient infection status.  The expected result is negative.  Fact Sheet for Patients:   StrictlyIdeas.no   Fact Sheet for Healthcare Providers:   BankingDealers.co.za    This test is not yet approved or cleared by the [place] FDA and  has been authorized for detection and/or diagnosis of SARS-CoV-2 by FDA under an Emergency Use Authorization (EUA).  This EUA will remain in effect (meaning this te st can be used) for the duration of  the COVID-19 declaration under Section 564(b)(1) of the Act, 21 U.S.C. section 360-bbb-3(b)(1), unless the authorization is terminated or revoked sooner.  Performed at [facility], [address] [place], [place] [address]       Radiology Studies: CT ANGIO CHEST PE W OR WO CONTRAST  Result Date: 11/14/2019 CLINICAL DATA:  COVID-19 positive, shortness of breath, hypoxic on room air EXAM: CT ANGIOGRAPHY CHEST WITH CONTRAST TECHNIQUE: Multidetector CT imaging of the chest was performed using the standard protocol during bolus administration of intravenous contrast. Multiplanar CT image reconstructions and MIPs were obtained to evaluate the vascular anatomy. CONTRAST:  136m OMNIPAQUE IOHEXOL 350 MG/ML SOLN COMPARISON:  Thyroid ultrasound 12/22/2013, CT angiography 03/29/2013 FINDINGS: Cardiovascular: Satisfactory opacification of the central pulmonary arteries. No central, lobar or proximal segmental filling defects are identified. More distal evaluation limited by extensive respiratory motion artifact. Central pulmonary arteries are upper limits normal caliber.  Cardiac size is top normal. No pericardial effusion. The aortic root is suboptimally assessed given cardiac pulsation artifact. No acute luminal abnormality of the imaged aorta. No periaortic stranding or hemorrhage. Shared origin of the brachiocephalic and left common carotid arteries. Proximal great vessels are otherwise unremarkable. Mediastinum/Nodes: Enlarging left lobe thyroid gland which displaces the trachea rightward and splays the common carotid vessels, extending into the thoracic inlet. Gland measures up to 4.4 cm in size, previously 3.5 cm at a similar level. Likely comprised of a dominant nodule seen on comparison thyroid ultrasound (12/22/2013). No mediastinal fluid or gas. No acute abnormality of the trachea or esophagus. No worrisome mediastinal, hilar or axillary adenopathy. Lungs/Pleura: Diffuse, heterogeneous mixed interstitial and ground-glass opacities present throughout both lungs with associated airways thickening compatible with typical reported imaging features of COVID-19. No pneumothorax. No effusion. Upper Abdomen: No acute abnormalities present in the visualized portions of the upper abdomen. Musculoskeletal: Multilevel degenerative changes are present in the imaged portions of the spine. No acute osseous abnormality or suspicious osseous lesion. Review of the MIP images confirms the above findings. IMPRESSION: 1. No evidence of central, lobar or proximal segmental pulmonary embolism. More distal evaluation limited by extensive respiratory motion artifact. 2. Diffuse, heterogeneous mixed interstitial and ground-glass opacities present throughout both lungs with associated airways thickening compatible with typical reported imaging features of COVID-19. 3. Interval enlargement of the previously evaluated heterogeneous left thyroid gland. Given interval change from the comparison study recommend outpatient thyroid ultrasound (reference: J Am Coll Radiol. 2015 Feb;12(2): 143-50).  Electronically Signed   By: PLovena LeM.D.   On: 11/17/2019 15:29   DG Chest Port 1 View  Result Date: 11/23/2019 CLINICAL DATA:  Shortness of, hypoxia, COVID-19 positive on home test, COPD EXAM: PORTABLE CHEST 1 VIEW COMPARISON:  Portable exam 1211 hours compared to 03/29/2013 FINDINGS: Upper normal heart size. Mediastinal contours normal. Patchy BILATERAL airspace infiltrates consistent with multifocal pneumonia. No pleural effusion, pneumothorax or acute osseous  findings. IMPRESSION: Patchy BILATERAL airspace infiltrates consistent with multifocal pneumonia. Electronically Signed   By: Lavonia Dana M.D.   On: 11/04/2019 12:18    Scheduled Meds: . vitamin C  500 mg Oral Daily  . baricitinib  4 mg Oral Daily  . docusate sodium  200 mg Oral BID  . enoxaparin (LOVENOX) injection  40 mg Subcutaneous Q24H  . insulin aspart  0-20 Units Subcutaneous TID WC  . insulin detemir  0.15 Units/kg Subcutaneous BID  . [START ON 11/12/2019] predniSONE  50 mg Oral Daily  . sodium chloride flush  3 mL Intravenous Q12H  . zinc sulfate  220 mg Oral Daily   Continuous Infusions: . sodium chloride    . methylPREDNISolone (SOLU-MEDROL) injection Stopped (11/10/19 0505)  . remdesivir 100 mg in NS 100 mL       LOS: 1 day   Time spent: 38 minutes   Darliss Cheney, MD [facility]  11/10/2019, 8:11 AM   To contact the attending provider between 7A-7P or the covering provider during after hours 7P-7A, please log into the web site [email or web address].

## 2019-11-11 DIAGNOSIS — J1282 Pneumonia due to coronavirus disease 2019: Secondary | ICD-10-CM

## 2019-11-11 DIAGNOSIS — J9600 Acute respiratory failure, unspecified whether with hypoxia or hypercapnia: Secondary | ICD-10-CM

## 2019-11-11 DIAGNOSIS — U071 COVID-19: Principal | ICD-10-CM

## 2019-11-11 LAB — COMPREHENSIVE METABOLIC PANEL
ALT: 35 U/L (ref 0–44)
AST: 57 U/L — ABNORMAL HIGH (ref 15–41)
Albumin: 3 g/dL — ABNORMAL LOW (ref 3.5–5.0)
Alkaline Phosphatase: 69 U/L (ref 38–126)
Anion gap: 10 (ref 5–15)
BUN: 20 mg/dL (ref 6–20)
CO2: 28 mmol/L (ref 22–32)
Calcium: 8.3 mg/dL — ABNORMAL LOW (ref 8.9–10.3)
Chloride: 101 mmol/L (ref 98–111)
Creatinine, Ser: 0.53 mg/dL (ref 0.44–1.00)
GFR calc Af Amer: >60 mL/min (ref >60)
GFR calc non Af Amer: >60 mL/min (ref >60)
Glucose, Bld: 232 mg/dL — ABNORMAL HIGH (ref 70–99)
Potassium: 4.3 mmol/L (ref 3.5–5.1)
Sodium: 139 mmol/L (ref 135–145)
Total Bilirubin: 0.7 mg/dL (ref 0.3–1.2)
Total Protein: 7.1 g/dL (ref 6.5–8.1)

## 2019-11-11 LAB — CBC WITH DIFFERENTIAL/PLATELET
Abs Immature Granulocytes: 0.06 10*3/uL (ref 0.00–0.07)
Basophils Absolute: 0 10*3/uL (ref 0.0–0.1)
Basophils Relative: 0 %
Eosinophils Absolute: 0 10*3/uL (ref 0.0–0.5)
Eosinophils Relative: 0 %
HCT: 39.2 % (ref 36.0–46.0)
Hemoglobin: 13.3 g/dL (ref 12.0–15.0)
Immature Granulocytes: 1 %
Lymphocytes Relative: 9 %
Lymphs Abs: 0.7 10*3/uL (ref 0.7–4.0)
MCH: 29.5 pg (ref 26.0–34.0)
MCHC: 33.9 g/dL (ref 30.0–36.0)
MCV: 86.9 fL (ref 80.0–100.0)
Monocytes Absolute: 0.3 10*3/uL (ref 0.1–1.0)
Monocytes Relative: 4 %
Neutro Abs: 6.7 10*3/uL (ref 1.7–7.7)
Neutrophils Relative %: 86 %
Platelets: 278 10*3/uL (ref 150–400)
RBC: 4.51 MIL/uL (ref 3.87–5.11)
RDW: 14.1 % (ref 11.5–15.5)
WBC: 7.7 10*3/uL (ref 4.0–10.5)
nRBC: 0 % (ref 0.0–0.2)

## 2019-11-11 LAB — GLUCOSE, CAPILLARY
Glucose-Capillary: 266 mg/dL — ABNORMAL HIGH (ref 70–99)
Glucose-Capillary: 279 mg/dL — ABNORMAL HIGH (ref 70–99)
Glucose-Capillary: 248 mg/dL — ABNORMAL HIGH (ref 70–99)
Glucose-Capillary: 179 mg/dL — ABNORMAL HIGH (ref 70–99)

## 2019-11-11 LAB — PROTIME-INR
INR: 1.4 — ABNORMAL HIGH (ref 0.8–1.2)
Prothrombin Time: 16.7 seconds — ABNORMAL HIGH (ref 11.4–15.2)

## 2019-11-11 LAB — C-REACTIVE PROTEIN: CRP: 4.2 mg/dL — ABNORMAL HIGH (ref <1.0)

## 2019-11-11 LAB — MAGNESIUM: Magnesium: 2.5 mg/dL — ABNORMAL HIGH (ref 1.7–2.4)

## 2019-11-11 LAB — FIBRIN DERIVATIVES D-DIMER (ARMC ONLY): Fibrin derivatives D-dimer (ARMC): 1063.34 ng/mL (FEU) — ABNORMAL HIGH (ref 0.00–499.00)

## 2019-11-11 LAB — FERRITIN: Ferritin: 1308 ng/mL — ABNORMAL HIGH (ref 11–307)

## 2019-11-11 LAB — PHOSPHORUS: Phosphorus: 2.6 mg/dL (ref 2.5–4.6)

## 2019-11-11 MED ORDER — INSULIN DETEMIR 100 UNIT/ML ~~LOC~~ SOLN
30.0000 [IU] | Freq: Two times a day (BID) | SUBCUTANEOUS | Status: DC
  Filled 2019-11-11 (×2): qty 0.3

## 2019-11-11 MED ORDER — WARFARIN - PHARMACIST DOSING INPATIENT: Freq: Every day | Status: DC

## 2019-11-11 MED ORDER — WARFARIN SODIUM 10 MG PO TABS
10.0000 mg | ORAL_TABLET | Freq: Once | ORAL | Status: AC
  Administered 2019-11-11: 10 mg via ORAL
  Filled 2019-11-11: qty 1

## 2019-11-11 MED ORDER — CHLORHEXIDINE GLUCONATE CLOTH 2 % EX PADS
6.0000 | MEDICATED_PAD | Freq: Every day | CUTANEOUS | Status: DC
  Administered 2019-11-11 – 2019-11-15 (×4): 6 via TOPICAL

## 2019-11-11 MED ORDER — ENOXAPARIN SODIUM 150 MG/ML ~~LOC~~ SOLN
125.0000 mg | Freq: Two times a day (BID) | SUBCUTANEOUS | Status: DC
  Administered 2019-11-11 – 2019-11-12 (×3): 125 mg via SUBCUTANEOUS
  Filled 2019-11-11 (×3): qty 0.84

## 2019-11-11 MED ORDER — INSULIN GLARGINE 100 UNIT/ML ~~LOC~~ SOLN: 20.0000 [IU] | Freq: Every day | SUBCUTANEOUS | Status: DC

## 2019-11-11 MED ORDER — INSULIN DETEMIR 100 UNIT/ML ~~LOC~~ SOLN
25.0000 [IU] | Freq: Two times a day (BID) | SUBCUTANEOUS | Status: DC
  Administered 2019-11-11 (×2): 25 [IU] via SUBCUTANEOUS
  Filled 2019-11-11 (×4): qty 0.25

## 2019-11-11 MED ORDER — METHYLPREDNISOLONE SODIUM SUCC 125 MG IJ SOLR
INTRAMUSCULAR | Status: AC
  Filled 2019-11-11: qty 2

## 2019-11-11 NOTE — Progress Notes (Addendum)
PROGRESS NOTE    Angel Campbell  AST:[id number] DOB: 1970/04/05 DOA: 11/17/2019 PCP: Marygrace Drought, MD   Brief Narrative:  HPI: Angel Campbell is a 49 y.o. 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  She admits to having nausea and vomiting but denies having any diarrhea. She denies having any chest pain, diaphoresis, palpitations or any urinary symptoms. Patient is unvaccinated and has had exposure to someone with COVID-19 infection She took her Covid self test at home on 11/08/19 which was positive Upon arrival to the ER she is noted to have room air pulse oximetry of 64% and is currently on a nonrebreather mask at 15 L.  She was initially placed on 6 L with pulse oximetry between 85 to 90%. Arterial blood gas pH 7.4, PCO2 46, PO2 37 O2 sat 71 Sodium 132, potassium 3.3, chloride 96, bicarb 24, glucose 325, BUN 8, creatinine 0.72, alk phos 69, AST 66, ALT 45, LDH 485, ferritin 742, white count 3.5, hemoglobin 12.9, hematocrit 36.7, MCV 84.8, RDW 13.6, fibrinogen 551, fibrin derivatives 1359. Chest x-ray reviewed by me shows patchy bilateral airspace infiltrates consistent with multifocal pneumonia. Twelve-lead EKG reviewed by me shows sinus rhythm with PACs    ED Course:  Patient is a 49 year old female with a history of morbid obesity, venous thromboembolic disease and COPD who presents to the ER for evaluation of worsening shortness of breath.  She is unvaccinated and was exposed to someone with  COVID-19 viral infection.  She had a home test on 11/08/19 which was positive and her PCR test here in the ER is positive as well.  Upon arrival to the ER she had room air pulse oximetry of 64% and is currently on a  nonrebreather mask and nasal cannula to maintain pulse oximetry greater than 92%.  Chest x-ray is consistent with Covid pneumonia.  She received a dose of Rocephin IV, Zithromax IV and 125 mg of Solu-Medrol in the ER.  She will be admitted to the hospital for further evaluation.  Assessment & Plan:   Principal Problem:   Pneumonia due to COVID-19 virus Active Problems:   DVT, bilateral lower limbs (HCC)   Obesity, Class III, BMI 40-49.9 (morbid obesity) (HCC)   Acute respiratory failure due to COVID-19 [facility])   COPD (chronic obstructive pulmonary disease) (HCC)   Hypokalemia  Acute hypoxic respiratory failure secondary to COVID-19 pneumonia: Patient was found to have room air pulse oximetry of 64%. She was initially tried on 6 L of oxygen via nasal cannula with pulse oximetry in the high 80s so she was placed on a nonrebreather mask and nasal cannula maintaining pulse oximetry greater than 92%.  She was ruled out of PE based on CT angiogram of chest. She is unvaccinated and was exposed to someone with COVID-19 viral infection COVID-19 PCR test positive on 09/15 Continue following IV Solu-Medrol Remdesivir per protocol Baricitinib, this was initiated by admitting hospitalist.  patient denies any known history of active diverticulitis, tuberculosis or hepatitis, understands the risks and benefits and wants to continue with Actemra/barititinib treatment if required. Supportive care with bronchodilator therapy and antitussives.  Zinc and vitamin C. Patient was encouraged to prone, out of bed to chair, to use incentive spirometry and flutter valve. The treatment plan and use  of medications and known side effects were discussed with patient/family, they were clearly explained that there is no proven definitive treatment for COVID-19 infection, any medications used here are based on published clinical articles/anecdotal data which are not peer-reviewed or randomized control trials.  Complete risks  and long-term side effects are unknown, however in the best clinical judgment they seem to be of some clinical benefit rather than medical risks.  Patient/family agree with the treatment plan and want to receive the given medications. Patient got worse yesterday and required to be on BiPAP and has been on BiPAP even this morning.  Despite of that, she was alert and was able to speak and did not look in distress.  I discussed with her that she may potentially end up requiring intubation.  She is okay with that.  I have discussed case with Dr. Mortimer Fries of ICU who will see in consultation but has advised to transfer patient to the ICU for close observation in case she might need urgent intubation.  Poorly controlled newly diagnosed type 2 diabetes mellitus with hyperglycemia: Hemoglobin A1c 10.0.  Previously nondiabetic.  Currently hyperglycemic.  Will increase Levemir to 25 units twice daily from 25 units daily.  And continue SSI.  Hypokalemia: Resolved  Morbid obesity (BMI 44) Complicates overall prognosis and care.  Weight loss encouraged.  History of venous thromboembolic disease Patient has a history of DVT and PE and is on chronic anticoagulation therapy with Coumadin. INR is subtherapeutic.  Coumadin and Lovenox was held yesterday due to significant drop in hemoglobin.  Hemoglobin rose today, likely was an error yesterday.  INR still subtherapeutic.  Will ask pharmacy to resume Coumadin but also start on therapeutic dose of Lovenox until INR becomes therapeutic.  COPD: Stable and not in acute exacerbation  DVT prophylaxis:    Code Status: Full Code  Family Communication:  None present at bedside.  Plan of care discussed with patient in length and he verbalized understanding and agreed with it.  Status is: Inpatient  Remains inpatient appropriate because:Hemodynamically unstable   Dispo: The patient is from: Home              Anticipated d/c is to: Home              Anticipated d/c  date is: > 3 days              Patient currently is not medically stable to d/c.        Estimated body mass index is 41.15 kg/m as calculated from the following:   Height as of this encounter: 5' 9"  (1.753 m).   Weight as of this encounter: 126.4 kg.      Nutritional status:               Consultants:   Dr. Mortimer Fries  Procedures:   None  Antimicrobials:  Anti-infectives (From admission, onward)   Start     Dose/Rate Route Frequency Ordered Stop   11/10/19 1000  remdesivir 100 mg in sodium chloride 0.9 % 100 mL IVPB       "Followed by" Linked Group Details   100 mg 200 mL/hr over 30 Minutes Intravenous Daily 11/07/2019 1426 11/14/19 0959   11/08/2019 1500  remdesivir 200 mg in sodium chloride 0.9% 250 mL IVPB       "Followed by" Linked Group Details   200 mg 580 mL/hr over 30 Minutes Intravenous Once 10/26/2019 1426 11/23/2019 1645   11/24/2019 1215  cefTRIAXone (ROCEPHIN) 2 g  in sodium chloride 0.9 % 100 mL IVPB        2 g 200 mL/hr over 30 Minutes Intravenous  Once 11/01/2019 1201 11/18/2019 1408   11/05/2019 1215  azithromycin (ZITHROMAX) 500 mg in sodium chloride 0.9 % 250 mL IVPB        500 mg 250 mL/hr over 60 Minutes Intravenous  Once 11/08/2019 1201 11/08/2019 1447         Subjective: Seen and examined.  She was on BiPAP but is still able to speak in full sentences.  Looked better and she stated that her breathing was better than yesterday.  Objective: Vitals:   11/11/19 0300 11/11/19 0426 11/11/19 0903 11/11/19 0949  BP:  106/62 97/61   Pulse:  70 67 68  Resp:  (!) 22 20 (!) 26  Temp:  98.1 F (36.7 C) 98.4 F (36.9 C)   TempSrc:  Oral    SpO2:  93% 91% 94%  Weight: 126.4 kg     Height:        Intake/Output Summary (Last 24 hours) at 11/11/2019 1047 Last data filed at 11/10/2019 1800 Gross per 24 hour  Intake 100 ml  Output --  Net 100 ml   Filed Weights   11/02/2019 1154 11/11/19 0300  Weight: 136.1 kg 126.4 kg    Examination:  General exam:  Appears comfortable on BiPAP, obese Respiratory system: Diminished breath sounds.  No wheezes or rhonchi. Cardiovascular system: S1 & S2 heard, RRR. No JVD, murmurs, rubs, gallops or clicks. No pedal edema. Gastrointestinal system: Abdomen is nondistended, soft and nontender. No organomegaly or masses felt. Normal bowel sounds heard. Central nervous system: Alert and oriented. No focal neurological deficits. Extremities: Symmetric 5 x 5 power. Skin: No rashes, lesions or ulcers.  Psychiatry: Judgement and insight appear normal. Mood & affect appropriate.   Data Reviewed: I have personally reviewed following labs and imaging studies  CBC: Recent Labs  Lab 11/03/2019 1208 11/10/19 0342 11/11/19 0539  WBC 3.5* 3.9* 7.7  NEUTROABS 2.7 3.1 6.7  HGB 12.9 9.6* 13.3  HCT 36.7 29.0* 39.2  MCV 84.8 88.7 86.9  PLT 183 263 416   Basic Metabolic Panel: Recent Labs  Lab 11/01/2019 1208 11/10/19 0342 11/11/19 0539  NA 132* 138 139  K 3.3* 4.0 4.3  CL 96* 103 101  CO2 24 25 28   GLUCOSE 325* 282* 232*  BUN 8 15 20   CREATININE 0.72 0.59 0.53  CALCIUM 7.8* 7.9* 8.3*  MG  --  2.4 2.5*  PHOS  --  2.9 2.6   GFR: Estimated Creatinine Clearance: 118.6 mL/min (by C-G formula based on SCr of 0.53 mg/dL). Liver Function Tests: Recent Labs  Lab 10/26/2019 1208 11/10/19 0342 11/11/19 0539  AST 66* 60* 57*  ALT 45* 40 35  ALKPHOS 69 68 69  BILITOT 0.8 0.7 0.7  PROT 7.1 7.0 7.1  ALBUMIN 3.2* 3.0* 3.0*   No results for input(s): LIPASE, AMYLASE in the last 168 hours. No results for input(s): AMMONIA in the last 168 hours. Coagulation Profile: Recent Labs  Lab 11/21/2019 1208 11/10/19 0342 11/11/19 0539  INR 1.6* 1.8* 1.4*   Cardiac Enzymes: No results for input(s): CKTOTAL, CKMB, CKMBINDEX, TROPONINI in the last 168 hours. BNP (last 3 results) No results for input(s): PROBNP in the last 8760 hours. HbA1C: Recent Labs    11/10/19 0342  HGBA1C 10.0*   CBG: Recent Labs  Lab  11/10/19 0904 11/10/19 1314 11/10/19 1657 11/10/19 2112 11/11/19 0859  GLUCAP  238* 301* 317* 235* 266*   Lipid Profile: Recent Labs    11/02/2019 1208  TRIG 79   Thyroid Function Tests: No results for input(s): TSH, T4TOTAL, FREET4, T3FREE, THYROIDAB in the last 72 hours. Anemia Panel: Recent Labs    11/10/19 0342 11/11/19 0539  FERRITIN 871* 1,308*   Sepsis Labs: Recent Labs  Lab 10/26/2019 1208  PROCALCITON 0.21    Recent Results (from the past 240 hour(s))  SARS Coronavirus 2 by RT PCR (hospital order, performed in [facility] hospital lab) Nasopharyngeal Nasopharyngeal Swab     Status: Abnormal   Collection Time: 11/21/2019 12:08 PM   Specimen: Nasopharyngeal Swab  Result Value Ref Range Status   SARS Coronavirus 2 POSITIVE (A) NEGATIVE Final    Comment: RESULT CALLED TO, READ BACK BY AND VERIFIED WITH: CHRISTINE HALLAS @1348  10/31/2019 MJU (NOTE) SARS-CoV-2 target nucleic acids are DETECTED  SARS-CoV-2 RNA is generally detectable in upper respiratory specimens  during the acute phase of infection.  Positive results are indicative  of the presence of the identified virus, but do not rule out bacterial infection or co-infection with other pathogens not detected by the test.  Clinical correlation with patient history and  other diagnostic information is necessary to determine patient infection status.  The expected result is negative.  Fact Sheet for Patients:   StrictlyIdeas.no   Fact Sheet for Healthcare Providers:   BankingDealers.co.za    This test is not yet approved or cleared by the [place] FDA and  has been authorized for detection and/or diagnosis of SARS-CoV-2 by FDA under an Emergency Use Authorization (EUA).  This EUA will remain in effect (meaning this te st can be used) for the duration of  the COVID-19 declaration under Section 564(b)(1) of the Act, 21 U.S.C. section 360-bbb-3(b)(1), unless the  authorization is terminated or revoked sooner.  Performed at [facility], [address] [place], [place] [address]       Radiology Studies: CT ANGIO CHEST PE W OR WO CONTRAST  Result Date: 11/19/2019 CLINICAL DATA:  COVID-19 positive, shortness of breath, hypoxic on room air EXAM: CT ANGIOGRAPHY CHEST WITH CONTRAST TECHNIQUE: Multidetector CT imaging of the chest was performed using the standard protocol during bolus administration of intravenous contrast. Multiplanar CT image reconstructions and MIPs were obtained to evaluate the vascular anatomy. CONTRAST:  190m OMNIPAQUE IOHEXOL 350 MG/ML SOLN COMPARISON:  Thyroid ultrasound 12/22/2013, CT angiography 03/29/2013 FINDINGS: Cardiovascular: Satisfactory opacification of the central pulmonary arteries. No central, lobar or proximal segmental filling defects are identified. More distal evaluation limited by extensive respiratory motion artifact. Central pulmonary arteries are upper limits normal caliber. Cardiac size is top normal. No pericardial effusion. The aortic root is suboptimally assessed given cardiac pulsation artifact. No acute luminal abnormality of the imaged aorta. No periaortic stranding or hemorrhage. Shared origin of the brachiocephalic and left common carotid arteries. Proximal great vessels are otherwise unremarkable. Mediastinum/Nodes: Enlarging left lobe thyroid gland which displaces the trachea rightward and splays the common carotid vessels, extending into the thoracic inlet. Gland measures up to 4.4 cm in size, previously 3.5 cm at a similar level. Likely comprised of a dominant nodule seen on comparison thyroid ultrasound (12/22/2013). No mediastinal fluid or gas. No acute abnormality of the trachea or esophagus. No worrisome mediastinal, hilar or axillary adenopathy. Lungs/Pleura: Diffuse, heterogeneous mixed interstitial and ground-glass opacities present throughout both lungs with associated airways thickening  compatible with typical reported imaging features of COVID-19. No pneumothorax. No effusion. Upper Abdomen: No  acute abnormalities present in the visualized portions of the upper abdomen. Musculoskeletal: Multilevel degenerative changes are present in the imaged portions of the spine. No acute osseous abnormality or suspicious osseous lesion. Review of the MIP images confirms the above findings. IMPRESSION: 1. No evidence of central, lobar or proximal segmental pulmonary embolism. More distal evaluation limited by extensive respiratory motion artifact. 2. Diffuse, heterogeneous mixed interstitial and ground-glass opacities present throughout both lungs with associated airways thickening compatible with typical reported imaging features of COVID-19. 3. Interval enlargement of the previously evaluated heterogeneous left thyroid gland. Given interval change from the comparison study recommend outpatient thyroid ultrasound (reference: J Am Coll Radiol. 2015 Feb;12(2): 143-50). Electronically Signed   By: Lovena Le M.D.   On: 11/05/2019 15:29   DG Chest Port 1 View  Result Date: 11/10/2019 CLINICAL DATA:  Shortness of, hypoxia, COVID-19 positive on home test, COPD EXAM: PORTABLE CHEST 1 VIEW COMPARISON:  Portable exam 1211 hours compared to 03/29/2013 FINDINGS: Upper normal heart size. Mediastinal contours normal. Patchy BILATERAL airspace infiltrates consistent with multifocal pneumonia. No pleural effusion, pneumothorax or acute osseous findings. IMPRESSION: Patchy BILATERAL airspace infiltrates consistent with multifocal pneumonia. Electronically Signed   By: Lavonia Dana M.D.   On: 11/01/2019 12:18    Scheduled Meds:  vitamin C  500 mg Oral Daily   baricitinib  4 mg Oral Daily   docusate sodium  200 mg Oral BID   insulin aspart  0-15 Units Subcutaneous TID WC   insulin aspart  0-5 Units Subcutaneous QHS   insulin aspart  4 Units Subcutaneous TID WC   insulin detemir  25 Units Subcutaneous QHS     [START ON 11/12/2019] predniSONE  50 mg Oral Daily   sodium chloride flush  3 mL Intravenous Q12H   zinc sulfate  220 mg Oral Daily   Continuous Infusions:  sodium chloride 250 mL (11/11/19 1024)   methylPREDNISolone (SOLU-MEDROL) injection 140 mg (11/11/19 0309)   remdesivir 100 mg in NS 100 mL 100 mg (11/11/19 1033)     LOS: 2 days   Time spent: 30 minutes   Darliss Cheney, MD [facility]  11/11/2019, 10:47 AM   To contact the attending provider between 7A-7P or the covering provider during after hours 7P-7A, please log into the web site [email or web address].

## 2019-11-11 NOTE — Consult Note (Signed)
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   Patient is unvaccinated and has had exposure to someone with COVID-19 infection  She took her Covidselftest at home on 09/14/21which was positive  Upon arrival to the ER she is noted to have room air pulse oximetry of 64%   She was initially placed on 6 L with pulse oximetry between 85 to 90%. Arterial blood gas pH 7.4, PCO2 46, PO2 37 O2 sat 71 Sodium 132, potassium 3.3, chloride 96, bicarb 24, glucose 325, BUN 8, creatinine 0.72, alk phos 69, AST 66, ALT 45, LDH 485, ferritin 742, white count 3.5, hemoglobin 12.9, hematocrit 36.7, MCV 84.8, RDW 13.6, fibrinogen 551, fibrin derivatives1359.   Chest x-ray reviewed by me shows patchy bilateral airspace infiltrates consistent with multifocal pneumonia. Twelve-lead EKG reviewed by me shows sinus rhythm with PACs    ED Course:She had a home test on 09/14/21which was positive and her PCR test here in the ER is positive as well.Upon arrival to the ER she had room air pulse oximetry of 64% and is currently on a nonrebreather mask and nasal cannula to maintain pulse oximetry greater than 92%.Chest x-ray is consistent with Covid pneumonia.She received a dose of Rocephin IV, Zithromax IV and 125 mg of Solu-Medrol in the ER. She will be admitted to the hospital for further evaluation.  EVENTS 9/15 admitted for COVID pneumonia 9/16 started on biPAP 917 transfererd to ICU for increased WOB and SOB, on biPAP  Patient remains critically ill Prognosis is guarded   BP 102/60 (BP Location: Left Arm)   Pulse 67   Temp 98 F (36.7 C) (Oral)    Resp (!) 24   Ht 5' 9"  (1.753 m)   Wt 126.4 kg   SpO2 92%   BMI 41.15 kg/m    I/O last 3 completed shifts: In: 150 [IV Piggyback:150] Out: 500 [Urine:500] Total I/O In: -  Out: 800 [Urine:800]  SpO2: 92 % O2 Flow Rate (L/min): 50 L/min FiO2 (%): 100 %  Estimated body mass index is 41.15 kg/m as calculated from the following:   Height as of this encounter: 5' 9"  (1.753 m).   Weight as of this encounter: 126.4 kg.   Review of Systems:  Gen:  +  Fever,+ resp distress + fatigue HEENT: Denies blurred vision, double vision, ear pain, eye pain, hearing loss, nose bleeds, sore throat Cardiac:  No dizziness, chest pain or heaviness, chest tightness,edema, No JVD Resp:   + cough, -sputum production, +shortness of breath, Gi: Denies swallowing difficulty, stomach pain, nausea or vomiting, diarrhea, constipation, bowel incontinence Gu:  Denies bladder incontinence, burning urine Ext:   Denies Joint pain, stiffness or swelling Skin: Denies  skin rash, easy bruising or bleeding or hives Endoc:  Denies polyuria, polydipsia , polyphagia or weight change Psych:   Denies depression, insomnia or hallucinations  Other:  All other systems negative   PHYSICAL EXAMINATION:  GENERAL:critically ill appearing, +resp distress PULMONARY: +rhonchi,  CARDIOVASCULAR: S1 and S2. Regular rate and rhythm. No murmurs, rubs, or gallops.  GASTROINTESTINAL: Soft, nontender, -distended. Positive bowel sounds.  MUSCULOSKELETAL: No swelling, clubbing, or edema.  NEUROLOGIC: alert and awake SKIN:intact,warm,dry  CBC    Component Value Date/Time  WBC 7.7 11/11/2019 0539   RBC 4.51 11/11/2019 0539   HGB 13.3 11/11/2019 0539   HGB 12.6 07/30/2013 0425   HCT 39.2 11/11/2019 0539   HCT 38.0 07/30/2013 0425   PLT 278 11/11/2019 0539   PLT 282 07/30/2013 0425   MCV 86.9 11/11/2019 0539   MCV 90 07/30/2013 0425   MCH 29.5 11/11/2019 0539   MCHC 33.9 11/11/2019 0539   RDW 14.1 11/11/2019 0539   RDW  14.3 07/30/2013 0425   LYMPHSABS 0.7 11/11/2019 0539   LYMPHSABS 1.7 07/30/2013 0425   MONOABS 0.3 11/11/2019 0539   MONOABS 0.4 07/30/2013 0425   EOSABS 0.0 11/11/2019 0539   EOSABS 0.2 07/30/2013 0425   BASOSABS 0.0 11/11/2019 0539   BASOSABS 0.1 07/30/2013 0425   BMP Latest Ref Rng & Units 11/11/2019 11/10/2019 11/05/2019  Glucose 70 - 99 mg/dL 232(H) 282(H) 325(H)  BUN 6 - 20 mg/dL 20 15 8   Creatinine 0.44 - 1.00 mg/dL 0.53 0.59 0.72  Sodium 135 - 145 mmol/L 139 138 132(L)  Potassium 3.5 - 5.1 mmol/L 4.3 4.0 3.3(L)  Chloride 98 - 111 mmol/L 101 103 96(L)  CO2 22 - 32 mmol/L 28 25 24   Calcium 8.9 - 10.3 mg/dL 8.3(L) 7.9(L) 7.8(L)    MEDICATIONS: I have reviewed all medications and confirmed regimen as documented   CULTURE RESULTS   Recent Results (from the past 240 hour(s))  SARS Coronavirus 2 by RT PCR (hospital order, performed in [facility] hospital lab) Nasopharyngeal Nasopharyngeal Swab     Status: Abnormal   Collection Time: 10/26/2019 12:08 PM   Specimen: Nasopharyngeal Swab  Result Value Ref Range Status   SARS Coronavirus 2 POSITIVE (A) NEGATIVE Final    Comment: RESULT CALLED TO, READ BACK BY AND VERIFIED WITH: CHRISTINE HALLAS @1348  10/29/2019 MJU (NOTE) SARS-CoV-2 target nucleic acids are DETECTED  SARS-CoV-2 RNA is generally detectable in upper respiratory specimens  during the acute phase of infection.  Positive results are indicative  of the presence of the identified virus, but do not rule out bacterial infection or co-infection with other pathogens not detected by the test.  Clinical correlation with patient history and  other diagnostic information is necessary to determine patient infection status.  The expected result is negative.  Fact Sheet for Patients:   StrictlyIdeas.no   Fact Sheet for Healthcare Providers:   BankingDealers.co.za    This test is not yet approved or cleared by the [place] FDA  and  has been authorized for detection and/or diagnosis of SARS-CoV-2 by FDA under an Emergency Use Authorization (EUA).  This EUA will remain in effect (meaning this te st can be used) for the duration of  the COVID-19 declaration under Section 564(b)(1) of the Act, 21 U.S.C. section 360-bbb-3(b)(1), unless the authorization is terminated or revoked sooner.  Performed at [facility], [address] [place], [place] [address]           IMAGING    No results found.   Nutrition Status:            ASSESSMENT AND PLAN SYNOPSIS  Acute hypoxemic respiratory failure due to COVID-19 pneumonia / ARDS On biPAP High risk for intubation Diuresis as blood pressure and renal function can tolerate diuresis as tolerated based on Kidney function Remdesivir  + BARIC IV STEROIDS   ACUTE DIASTOLIC CARDIAC FAILURE-  -oxygen as needed -Lasix as tolerated  Morbid obesity, possible OSA.   Will certainly impact respiratory mechanics  CARDIAC ICU monitoring  GI GI PROPHYLAXIS as indicated   DIET--> as tolerated Constipation protocol as indicated  ENDO - will use ICU hypoglycemic\Hyperglycemia protocol if indicated     ELECTROLYTES -follow labs as needed -replace as needed -pharmacy consultation and following   DVT/GI PRX ordered and assessed TRANSFUSIONS AS NEEDED MONITOR FSBS I Assessed the need for Labs I Assessed the need for Foley I Assessed the need for Central Venous Line Family Discussion when available I Assessed the need for Mobilization I made an Assessment of medications to be adjusted accordingly Safety Risk assessment completed   CASE DISCUSSED IN MULTIDISCIPLINARY ROUNDS WITH ICU TEAM  Critical Care Time devoted to patient care services described in this note is 45 minutes.   Overall, patient is critically ill, prognosis is guarded.    Corrin Parker, M.D.  [facility]  Medical Director  [place] Director [facility] Cardio-Pulmonary Department

## 2019-11-11 NOTE — Progress Notes (Signed)
Patient transferred to ICU with RT and RN on Bipap. Patient in stable condition. Report given to [employer].

## 2019-11-11 NOTE — Progress Notes (Signed)
Pt has remained alert and oriented throughout shift. Titrated Bipap down to HFNC and NRB. Pt maintaining O2 sats above 88%. SB/NSR. Will continue to monitor.

## 2019-11-11 NOTE — Consult Note (Signed)
ANTICOAGULATION CONSULT NOTE  Pharmacy Consult for Warfarin Indication: History of VTE  Patient Measurements: Height: 5\' 9"  (175.3 cm) Weight: 126.4 kg (278 lb 10.6 oz) IBW/kg (Calculated) : 66.2  Vital Signs: Temp: 98.4 F (36.9 C) (09/17 0903) Temp Source: Oral (09/17 0426) BP: 97/61 (09/17 0903) Pulse Rate: 67 (09/17 0903)  Labs: Recent Labs    12-08-2019 1208 12/08/2019 1208 11/10/19 0342 11/11/19 0539  HGB 12.9   < > 9.6* 13.3  HCT 36.7  --  29.0* 39.2  PLT 183  --  263 278  LABPROT 18.6*  --  19.9* 16.7*  INR 1.6*  --  1.8* 1.4*  CREATININE 0.72  --  0.59 0.53   < > = values in this interval not displayed.    Estimated Creatinine Clearance: 118.6 mL/min (by C-G formula based on SCr of 0.53 mg/dL).   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 Pharmacy has been consulted to manage warfarin while inpatient.   Hgb 12.9 dropped to 9.6 on 9/16; no apparent signs/symptoms of bleeding. 9/17 Hgb recovered to 13.3. MD is okay to continue warfarin today and also starting her on therapeutic lovenox.   Date INR Plan  9/14 Unk Last home dose  9/15 1.5 Not resumed  9/16 1.8 Hold  9/17 1.4     Goal of Therapy:  INR 2-3   Plan:  INR is subtherapeutic at 1.4. Will continue patient's home regimen and give 10mg  tonight. Patient has Enoxaparin bridge - 125mg  Q12h Continue to monitor daily INR and CBC  10/17, PharmD Pharmacy Resident  11/11/2019 12:28 PM

## 2019-11-12 ENCOUNTER — Inpatient Hospital Stay: Payer: BC Managed Care – PPO

## 2019-11-12 LAB — CBC WITH DIFFERENTIAL/PLATELET
Abs Immature Granulocytes: 0.12 10*3/uL — ABNORMAL HIGH (ref 0.00–0.07)
Basophils Absolute: 0 10*3/uL (ref 0.0–0.1)
Basophils Relative: 0 %
Eosinophils Absolute: 0 10*3/uL (ref 0.0–0.5)
Eosinophils Relative: 0 %
HCT: 40.7 % (ref 36.0–46.0)
Hemoglobin: 13.6 g/dL (ref 12.0–15.0)
Immature Granulocytes: 2 %
Lymphocytes Relative: 8 %
Lymphs Abs: 0.6 10*3/uL — ABNORMAL LOW (ref 0.7–4.0)
MCH: 29 pg (ref 26.0–34.0)
MCHC: 33.4 g/dL (ref 30.0–36.0)
MCV: 86.8 fL (ref 80.0–100.0)
Monocytes Absolute: 0.3 10*3/uL (ref 0.1–1.0)
Monocytes Relative: 4 %
Neutro Abs: 5.9 10*3/uL (ref 1.7–7.7)
Neutrophils Relative %: 86 %
Platelets: 323 10*3/uL (ref 150–400)
RBC: 4.69 MIL/uL (ref 3.87–5.11)
RDW: 13.9 % (ref 11.5–15.5)
WBC: 7 10*3/uL (ref 4.0–10.5)
nRBC: 0 % (ref 0.0–0.2)

## 2019-11-12 LAB — COMPREHENSIVE METABOLIC PANEL
ALT: 32 U/L (ref 0–44)
AST: 47 U/L — ABNORMAL HIGH (ref 15–41)
Albumin: 2.9 g/dL — ABNORMAL LOW (ref 3.5–5.0)
Alkaline Phosphatase: 77 U/L (ref 38–126)
Anion gap: 11 (ref 5–15)
BUN: 20 mg/dL (ref 6–20)
CO2: 27 mmol/L (ref 22–32)
Calcium: 8 mg/dL — ABNORMAL LOW (ref 8.9–10.3)
Chloride: 101 mmol/L (ref 98–111)
Creatinine, Ser: 0.74 mg/dL (ref 0.44–1.00)
GFR calc Af Amer: >60 mL/min (ref >60)
GFR calc non Af Amer: >60 mL/min (ref >60)
Glucose, Bld: 252 mg/dL — ABNORMAL HIGH (ref 70–99)
Potassium: 4.2 mmol/L (ref 3.5–5.1)
Sodium: 139 mmol/L (ref 135–145)
Total Bilirubin: 0.8 mg/dL (ref 0.3–1.2)
Total Protein: 6.9 g/dL (ref 6.5–8.1)

## 2019-11-12 LAB — PROTIME-INR
INR: 2 — ABNORMAL HIGH (ref 0.8–1.2)
Prothrombin Time: 21.6 seconds — ABNORMAL HIGH (ref 11.4–15.2)

## 2019-11-12 LAB — C-REACTIVE PROTEIN: CRP: 1.4 mg/dL — ABNORMAL HIGH (ref <1.0)

## 2019-11-12 LAB — GLUCOSE, CAPILLARY
Glucose-Capillary: 260 mg/dL — ABNORMAL HIGH (ref 70–99)
Glucose-Capillary: 280 mg/dL — ABNORMAL HIGH (ref 70–99)
Glucose-Capillary: 259 mg/dL — ABNORMAL HIGH (ref 70–99)
Glucose-Capillary: 286 mg/dL — ABNORMAL HIGH (ref 70–99)

## 2019-11-12 LAB — FIBRIN DERIVATIVES D-DIMER (ARMC ONLY): Fibrin derivatives D-dimer (ARMC): 768.22 ng/mL (FEU) — ABNORMAL HIGH (ref 0.00–499.00)

## 2019-11-12 LAB — PHOSPHORUS: Phosphorus: 3.7 mg/dL (ref 2.5–4.6)

## 2019-11-12 LAB — FERRITIN: Ferritin: 1032 ng/mL — ABNORMAL HIGH (ref 11–307)

## 2019-11-12 LAB — MAGNESIUM: Magnesium: 2.4 mg/dL (ref 1.7–2.4)

## 2019-11-12 MED ORDER — WARFARIN SODIUM 5 MG PO TABS
5.0000 mg | ORAL_TABLET | Freq: Once | ORAL | Status: AC
  Administered 2019-11-12: 5 mg via ORAL
  Filled 2019-11-12: qty 1

## 2019-11-12 MED ORDER — INSULIN DETEMIR 100 UNIT/ML ~~LOC~~ SOLN
30.0000 [IU] | Freq: Two times a day (BID) | SUBCUTANEOUS | Status: DC
  Administered 2019-11-12 (×2): 30 [IU] via SUBCUTANEOUS
  Filled 2019-11-12 (×4): qty 0.3

## 2019-11-12 MED ORDER — FUROSEMIDE 10 MG/ML IJ SOLN
40.0000 mg | Freq: Once | INTRAMUSCULAR | Status: AC
  Administered 2019-11-12: 40 mg via INTRAVENOUS
  Filled 2019-11-12: qty 4

## 2019-11-12 NOTE — Consult Note (Addendum)
ANTICOAGULATION CONSULT NOTE  Pharmacy Consult for Warfarin Indication: History of VTE  Patient Measurements: Height: 5\' 9"  (175.3 cm) Weight: 126.4 kg (278 lb 10.6 oz) IBW/kg (Calculated) : 66.2  Vital Signs: Temp: 97.1 F (36.2 C) (09/18 0813) Temp Source: Axillary (09/18 0813) BP: 107/90 (09/18 0800) Pulse Rate: 71 (09/18 0800)  Labs: Recent Labs    11/10/19 0342 11/10/19 0342 11/11/19 0539 11/12/19 0718  HGB 9.6*   < > 13.3 13.6  HCT 29.0*  --  39.2 40.7  PLT 263  --  278 323  LABPROT 19.9*  --  16.7* 21.6*  INR 1.8*  --  1.4* 2.0*  CREATININE 0.59  --  0.53 0.74   < > = values in this interval not displayed.    Estimated Creatinine Clearance: 118.6 mL/min (by C-G formula based on SCr of 0.74 mg/dL).   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 Pharmacy has been consulted to manage warfarin while inpatient.   Hgb 12.9 dropped to 9.6 on 9/16; no apparent signs/symptoms of bleeding. 9/17 Hgb recovered to 13.3. MD is okay to continue warfarin today and also starting her on therapeutic lovenox.   Date INR Plan  9/14 Unk Last home dose  9/15 1.6 Not resumed  9/16 1.8 Hold  9/17 1.4 10 mg  9/18 2.0     Goal of Therapy:  INR 2-3   Plan:  INR borderline therapeutic; significant increase in INR with dose last night after holding for two days. Will continue patient's home regimen and give warfarin 5 mg PO x1 tonight. Patient has Enoxaparin bridge - 125mg  Q12h - will continue until INR therapeutic x2 since INR is borderline Continue to monitor daily INR and CBC - Hgb/plt count stable today  10/18, PharmD, BCPS 11/12/2019 9:14 AM

## 2019-11-12 NOTE — Progress Notes (Signed)
PROGRESS NOTE    Angel Campbell  XJO:[id number] DOB: 05/01/1970 DOA: 10/31/2019 PCP: Marygrace Drought, MD   Brief Narrative:  Angel Campbell is a 49 y.o. 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 Patient is unvaccinated and has had exposure to someone with COVID-19 infection She took her Covid self test at home on 11/08/19 which was positive Upon arrival to the ER she was noted to have room air pulse oximetry of 64% and was a started on a nonrebreather mask at 15 L.   Arterial blood gas pH 7.4, PCO2 46, PO2 37 O2 sat 71 Sodium 132, potassium 3.3, chloride 96, bicarb 24, glucose 325, BUN 8, creatinine 0.72, alk phos 69, AST 66, ALT 45, LDH 485, ferritin 742, white count 3.5, hemoglobin 12.9, hematocrit 36.7, MCV 84.8, RDW 13.6, fibrinogen 551, fibrin derivatives 1359. Chest x-ray  shows patchy bilateral airspace infiltrates consistent with multifocal pneumonia.  Patient was started under hospitalist service with acute hypoxic respiratory failure secondary to COVID-19 pneumonia.  She was ruled out of PE based on CT angiogram of the chest.  She was started on IV Solu-Medrol, remdesivir and baricitinib.  Assessment & Plan:   Principal Problem:   Pneumonia due to COVID-19 virus Active Problems:   DVT, bilateral lower limbs (HCC)   Obesity, Class III, BMI 40-49.9 (morbid obesity) (HCC)   Acute respiratory failure due to COVID-19 [facility])   COPD (chronic obstructive pulmonary disease) (HCC)   Hypokalemia  Acute hypoxic respiratory failure secondary to COVID-19 pneumonia: Patient was found to have room air pulse oximetry of 64%. She was initially tried on 6 L of oxygen via nasal cannula with pulse oximetry in the high  80s so she was placed on a nonrebreather mask and nasal cannula maintaining pulse oximetry greater than 92%.  She was ruled out of PE based on CT angiogram of chest. Feels much better.  More calm today.  Now on high flow oxygen instead of BiPAP. Continue following IV Solu-Medrol Remdesivir per protocol Baricitinib, this was initiated by admitting hospitalist.  patient denies any known history of active diverticulitis, tuberculosis or hepatitis, understands the risks and benefits and wants to continue with Actemra/barititinib treatment if required. Supportive care with bronchodilator therapy and antitussives.  Zinc and vitamin C. Patient was encouraged to prone, out of bed to chair, to use incentive spirometry and flutter valve.  Poorly controlled newly diagnosed type 2 diabetes mellitus with hyperglycemia: Hemoglobin A1c 10.0.  Previously nondiabetic.  Currently hyperglycemic.  Will increase Levemir to 30 units twice daily from 25 units daily.  And continue SSI.  Hypokalemia: Resolved  Morbid obesity (BMI 44) Complicates overall prognosis and care.  Weight loss encouraged.  History of venous thromboembolic disease Patient has a history of DVT and PE and is on chronic anticoagulation therapy with Coumadin.  INR now therapeutic.  We will discontinue Lovenox.  COPD: Stable and not in acute exacerbation  DVT prophylaxis:    Code Status: Full Code  Family Communication:  None present at bedside.  Plan of care discussed with patient in length and he verbalized understanding and agreed with it.  Status is: Inpatient  Remains inpatient appropriate because:Hemodynamically unstable   Dispo: The patient is from: Home  Anticipated d/c is to: Home              Anticipated d/c date is: > 3 days              Patient currently is not medically stable to d/c.        Estimated body mass index is 41.15 kg/m as calculated from the following:   Height as of this encounter: 5'  9" (1.753 m).   Weight as of this encounter: 126.4 kg.      Nutritional status:               Consultants:   Dr. Mortimer Fries  Procedures:   None  Antimicrobials:  Anti-infectives (From admission, onward)   Start     Dose/Rate Route Frequency Ordered Stop   11/10/19 1000  remdesivir 100 mg in sodium chloride 0.9 % 100 mL IVPB       "Followed by" Linked Group Details   100 mg 200 mL/hr over 30 Minutes Intravenous Daily 11/20/2019 1426 11/14/19 0959   11/08/2019 1500  remdesivir 200 mg in sodium chloride 0.9% 250 mL IVPB       "Followed by" Linked Group Details   200 mg 580 mL/hr over 30 Minutes Intravenous Once 11/23/2019 1426 11/22/2019 1645   11/05/2019 1215  cefTRIAXone (ROCEPHIN) 2 g in sodium chloride 0.9 % 100 mL IVPB        2 g 200 mL/hr over 30 Minutes Intravenous  Once 10/31/2019 1201 11/08/2019 1408   11/07/2019 1215  azithromycin (ZITHROMAX) 500 mg in sodium chloride 0.9 % 250 mL IVPB        500 mg 250 mL/hr over 60 Minutes Intravenous  Once 11/21/2019 1201 10/26/2019 1447         Subjective: Seen and examined.  Now downgraded to high flow instead of BiPAP and she is able to speak in full sentences, looks more calm and feels much better.  Objective: Vitals:   11/12/19 0800 11/12/19 0813 11/12/19 0900 11/12/19 1000  BP: 107/90  115/80 104/60  Pulse: 71  80 72  Resp: 17  (!) 26 (!) 26  Temp:  (!) 97.1 F (36.2 C)    TempSrc:  Axillary    SpO2: 94%  96% 92%  Weight:      Height:        Intake/Output Summary (Last 24 hours) at 11/12/2019 1122 Last data filed at 11/12/2019 1000 Gross per 24 hour  Intake 1219.97 ml  Output 950 ml  Net 269.97 ml   Filed Weights   10/28/2019 1154 11/11/19 0300  Weight: 136.1 kg 126.4 kg    Examination:  General exam: Appears calm and comfortable, morbidly obese Respiratory system: Diminished breath sounds globally. Respiratory effort normal. Cardiovascular system: S1 & S2 heard, RRR. No JVD, murmurs, rubs, gallops or clicks. No  pedal edema. Gastrointestinal system: Abdomen is nondistended, soft and nontender. No organomegaly or masses felt. Normal bowel sounds heard. Central nervous system: Alert and oriented. No focal neurological deficits. Extremities: Symmetric 5 x 5 power. Skin: No rashes, lesions or ulcers.  Psychiatry: Judgement and insight appear normal. Mood & affect appropriate.   Data Reviewed: I have personally reviewed following labs and imaging studies  CBC: Recent Labs  Lab 11/02/2019 1208 11/10/19 0342 11/11/19 0539 11/12/19 0718  WBC 3.5* 3.9* 7.7 7.0  NEUTROABS 2.7 3.1 6.7 5.9  HGB 12.9 9.6* 13.3 13.6  HCT 36.7 29.0* 39.2 40.7  MCV 84.8 88.7 86.9 86.8  PLT 183 263 278  161   Basic Metabolic Panel: Recent Labs  Lab 11/10/2019 1208 11/10/19 0342 11/11/19 0539 11/12/19 0718  NA 132* 138 139 139  K 3.3* 4.0 4.3 4.2  CL 96* 103 101 101  CO2 24 25 28 27   GLUCOSE 325* 282* 232* 252*  BUN 8 15 20 20   CREATININE 0.72 0.59 0.53 0.74  CALCIUM 7.8* 7.9* 8.3* 8.0*  MG  --  2.4 2.5* 2.4  PHOS  --  2.9 2.6 3.7   GFR: Estimated Creatinine Clearance: 118.6 mL/min (by C-G formula based on SCr of 0.74 mg/dL). Liver Function Tests: Recent Labs  Lab 11/03/2019 1208 11/10/19 0342 11/11/19 0539 11/12/19 0718  AST 66* 60* 57* 47*  ALT 45* 40 35 32  ALKPHOS 69 68 69 77  BILITOT 0.8 0.7 0.7 0.8  PROT 7.1 7.0 7.1 6.9  ALBUMIN 3.2* 3.0* 3.0* 2.9*   No results for input(s): LIPASE, AMYLASE in the last 168 hours. No results for input(s): AMMONIA in the last 168 hours. Coagulation Profile: Recent Labs  Lab 11/19/2019 1208 11/10/19 0342 11/11/19 0539 11/12/19 0718  INR 1.6* 1.8* 1.4* 2.0*   Cardiac Enzymes: No results for input(s): CKTOTAL, CKMB, CKMBINDEX, TROPONINI in the last 168 hours. BNP (last 3 results) No results for input(s): PROBNP in the last 8760 hours. HbA1C: Recent Labs    11/10/19 0342  HGBA1C 10.0*   CBG: Recent Labs  Lab 11/11/19 0859 11/11/19 1214 11/11/19 1609  11/11/19 2158 11/12/19 0801  GLUCAP 266* 279* 248* 179* 260*   Lipid Profile: Recent Labs    11/05/2019 1208  TRIG 79   Thyroid Function Tests: No results for input(s): TSH, T4TOTAL, FREET4, T3FREE, THYROIDAB in the last 72 hours. Anemia Panel: Recent Labs    11/11/19 0539 11/12/19 0718  FERRITIN 1,308* 1,032*   Sepsis Labs: Recent Labs  Lab 11/14/2019 1208  PROCALCITON 0.21    Recent Results (from the past 240 hour(s))  SARS Coronavirus 2 by RT PCR (hospital order, performed in [facility] hospital lab) Nasopharyngeal Nasopharyngeal Swab     Status: Abnormal   Collection Time: 11/02/2019 12:08 PM   Specimen: Nasopharyngeal Swab  Result Value Ref Range Status   SARS Coronavirus 2 POSITIVE (A) NEGATIVE Final    Comment: RESULT CALLED TO, READ BACK BY AND VERIFIED WITH: CHRISTINE HALLAS @1348  10/28/2019 MJU (NOTE) SARS-CoV-2 target nucleic acids are DETECTED  SARS-CoV-2 RNA is generally detectable in upper respiratory specimens  during the acute phase of infection.  Positive results are indicative  of the presence of the identified virus, but do not rule out bacterial infection or co-infection with other pathogens not detected by the test.  Clinical correlation with patient history and  other diagnostic information is necessary to determine patient infection status.  The expected result is negative.  Fact Sheet for Patients:   StrictlyIdeas.no   Fact Sheet for Healthcare Providers:   BankingDealers.co.za    This test is not yet approved or cleared by the [place] FDA and  has been authorized for detection and/or diagnosis of SARS-CoV-2 by FDA under an Emergency Use Authorization (EUA).  This EUA will remain in effect (meaning this te st can be used) for the duration of  the COVID-19 declaration under Section 564(b)(1) of the Act, 21 U.S.C. section 360-bbb-3(b)(1), unless the authorization is terminated or revoked  sooner.  Performed at [facility], [address] [place], [place] [address]       Radiology Studies: [facility] 1 View  Result  Date: 11/12/2019 CLINICAL DATA:  Acute respiratory failure with hypoxia. COVID positive EXAM: PORTABLE CHEST 1 VIEW COMPARISON:  10/26/2019 FINDINGS: Mild cardiac enlargement. Decreased lung volumes. Diffuse bilateral interstitial and airspace densities appear unchanged from previous exam. No new findings. IMPRESSION: 1. No change in aeration to the lungs compared to previous exam. 2. Stable appearance of bilateral interstitial and airspace densities. Electronically Signed   By: Kerby Moors M.D.   On: 11/12/2019 06:11    Scheduled Meds: . vitamin C  500 mg Oral Daily  . baricitinib  4 mg Oral Daily  . Chlorhexidine Gluconate Cloth  6 each Topical Daily  . docusate sodium  200 mg Oral BID  . enoxaparin (LOVENOX) injection  125 mg Subcutaneous Q12H  . insulin aspart  0-15 Units Subcutaneous TID WC  . insulin aspart  0-5 Units Subcutaneous QHS  . insulin aspart  4 Units Subcutaneous TID WC  . insulin detemir  30 Units Subcutaneous BID  . predniSONE  50 mg Oral Daily  . sodium chloride flush  3 mL Intravenous Q12H  . warfarin  5 mg Oral ONCE-1600  . Warfarin - Pharmacist Dosing Inpatient   Does not apply q1600  . zinc sulfate  220 mg Oral Daily   Continuous Infusions: . sodium chloride 250 mL (11/12/19 1050)  . methylPREDNISolone (SOLU-MEDROL) injection Stopped (11/11/19 2113)  . remdesivir 100 mg in NS 100 mL 100 mg (11/12/19 1051)     LOS: 3 days   Time spent: 30 minutes   Darliss Cheney, MD [facility]  11/12/2019, 11:22 AM   To contact the attending provider between 7A-7P or the covering provider during after hours 7P-7A, please log into the web site [email or web address].

## 2019-11-12 NOTE — Progress Notes (Addendum)
Shift summary:  - Patient remains alert and oriented. - High risk of intubation. - Pushing aggressive pulmonary hygiene. - Weaning O2 as tolerated.

## 2019-11-12 NOTE — Plan of Care (Signed)
  Problem: Education: Goal: Knowledge of risk factors and measures for prevention of condition will improve Outcome: Progressing   Problem: Coping: Goal: Psychosocial and spiritual needs will be supported Outcome: Progressing   Problem: Respiratory: Goal: Complications related to the disease process, condition or treatment will be avoided or minimized Outcome: Progressing   

## 2019-11-12 NOTE — Plan of Care (Signed)
  Problem: Education: Goal: Knowledge of risk factors and measures for prevention of condition will improve Outcome: Not Progressing   Problem: Coping: Goal: Psychosocial and spiritual needs will be supported Outcome: Not Progressing   Problem: Respiratory: Goal: Will maintain a patent airway Outcome: Not Progressing Goal: Complications related to the disease process, condition or treatment will be avoided or minimized Outcome: Not Progressing   

## 2019-11-13 ENCOUNTER — Inpatient Hospital Stay: Payer: BC Managed Care – PPO

## 2019-11-13 LAB — BLOOD GAS, ARTERIAL
Acid-Base Excess: 6.6 mmol/L — ABNORMAL HIGH (ref 0.0–2.0)
Bicarbonate: 30.5 mmol/L — ABNORMAL HIGH (ref 20.0–28.0)
FIO2: 100
O2 Saturation: 91.2 %
Patient temperature: 37
pCO2 arterial: 40 mmHg (ref 32.0–48.0)
pH, Arterial: 7.49 — ABNORMAL HIGH (ref 7.350–7.450)
pO2, Arterial: 56 mmHg — ABNORMAL LOW (ref 83.0–108.0)

## 2019-11-13 LAB — PROTIME-INR
INR: 2.3 — ABNORMAL HIGH (ref 0.8–1.2)
Prothrombin Time: 24.6 seconds — ABNORMAL HIGH (ref 11.4–15.2)

## 2019-11-13 LAB — COMPREHENSIVE METABOLIC PANEL
ALT: 29 U/L (ref 0–44)
AST: 36 U/L (ref 15–41)
Albumin: 3 g/dL — ABNORMAL LOW (ref 3.5–5.0)
Alkaline Phosphatase: 79 U/L (ref 38–126)
Anion gap: 10 (ref 5–15)
BUN: 20 mg/dL (ref 6–20)
CO2: 29 mmol/L (ref 22–32)
Calcium: 8.2 mg/dL — ABNORMAL LOW (ref 8.9–10.3)
Chloride: 99 mmol/L (ref 98–111)
Creatinine, Ser: 0.56 mg/dL (ref 0.44–1.00)
GFR calc Af Amer: >60 mL/min (ref >60)
GFR calc non Af Amer: >60 mL/min (ref >60)
Glucose, Bld: 247 mg/dL — ABNORMAL HIGH (ref 70–99)
Potassium: 3.7 mmol/L (ref 3.5–5.1)
Sodium: 138 mmol/L (ref 135–145)
Total Bilirubin: 0.8 mg/dL (ref 0.3–1.2)
Total Protein: 7.1 g/dL (ref 6.5–8.1)

## 2019-11-13 LAB — GLUCOSE, CAPILLARY
Glucose-Capillary: 238 mg/dL — ABNORMAL HIGH (ref 70–99)
Glucose-Capillary: 241 mg/dL — ABNORMAL HIGH (ref 70–99)
Glucose-Capillary: 229 mg/dL — ABNORMAL HIGH (ref 70–99)
Glucose-Capillary: 283 mg/dL — ABNORMAL HIGH (ref 70–99)

## 2019-11-13 LAB — CBC WITH DIFFERENTIAL/PLATELET
Abs Immature Granulocytes: 0.19 10*3/uL — ABNORMAL HIGH (ref 0.00–0.07)
Basophils Absolute: 0 10*3/uL (ref 0.0–0.1)
Basophils Relative: 0 %
Eosinophils Absolute: 0 10*3/uL (ref 0.0–0.5)
Eosinophils Relative: 0 %
HCT: 42.7 % (ref 36.0–46.0)
Hemoglobin: 14.5 g/dL (ref 12.0–15.0)
Immature Granulocytes: 2 %
Lymphocytes Relative: 11 %
Lymphs Abs: 0.9 10*3/uL (ref 0.7–4.0)
MCH: 29.1 pg (ref 26.0–34.0)
MCHC: 34 g/dL (ref 30.0–36.0)
MCV: 85.6 fL (ref 80.0–100.0)
Monocytes Absolute: 0.4 10*3/uL (ref 0.1–1.0)
Monocytes Relative: 4 %
Neutro Abs: 7.1 10*3/uL (ref 1.7–7.7)
Neutrophils Relative %: 83 %
Platelets: 334 10*3/uL (ref 150–400)
RBC: 4.99 MIL/uL (ref 3.87–5.11)
RDW: 13.4 % (ref 11.5–15.5)
Smear Review: NORMAL
WBC: 8.6 10*3/uL (ref 4.0–10.5)
nRBC: 0 % (ref 0.0–0.2)

## 2019-11-13 LAB — MAGNESIUM: Magnesium: 2.4 mg/dL (ref 1.7–2.4)

## 2019-11-13 LAB — FERRITIN: Ferritin: 804 ng/mL — ABNORMAL HIGH (ref 11–307)

## 2019-11-13 LAB — PHOSPHORUS: Phosphorus: 3.2 mg/dL (ref 2.5–4.6)

## 2019-11-13 LAB — FIBRIN DERIVATIVES D-DIMER (ARMC ONLY): Fibrin derivatives D-dimer (ARMC): 664.92 ng/mL (FEU) — ABNORMAL HIGH (ref 0.00–499.00)

## 2019-11-13 LAB — C-REACTIVE PROTEIN: CRP: 0.7 mg/dL (ref <1.0)

## 2019-11-13 MED ORDER — FUROSEMIDE 10 MG/ML IJ SOLN
40.0000 mg | Freq: Once | INTRAMUSCULAR | Status: AC
  Administered 2019-11-13: 40 mg via INTRAVENOUS
  Filled 2019-11-13: qty 4

## 2019-11-13 MED ORDER — INSULIN ASPART 100 UNIT/ML ~~LOC~~ SOLN
0.0000 [IU] | SUBCUTANEOUS | Status: DC
  Administered 2019-11-13: 5 [IU] via SUBCUTANEOUS
  Administered 2019-11-13: 8 [IU] via SUBCUTANEOUS
  Administered 2019-11-13 (×2): 5 [IU] via SUBCUTANEOUS
  Administered 2019-11-14: 8 [IU] via SUBCUTANEOUS
  Administered 2019-11-14 (×2): 5 [IU] via SUBCUTANEOUS
  Administered 2019-11-14: 8 [IU] via SUBCUTANEOUS
  Administered 2019-11-15 (×2): 3 [IU] via SUBCUTANEOUS
  Filled 2019-11-13 (×10): qty 1

## 2019-11-13 MED ORDER — INSULIN DETEMIR 100 UNIT/ML ~~LOC~~ SOLN
40.0000 [IU] | Freq: Two times a day (BID) | SUBCUTANEOUS | Status: DC
  Administered 2019-11-13 – 2019-11-15 (×4): 40 [IU] via SUBCUTANEOUS
  Filled 2019-11-13 (×7): qty 0.4

## 2019-11-13 MED ORDER — WARFARIN SODIUM 5 MG PO TABS
5.0000 mg | ORAL_TABLET | Freq: Once | ORAL | Status: AC
  Administered 2019-11-13: 5 mg via ORAL
  Filled 2019-11-13: qty 1

## 2019-11-13 NOTE — Progress Notes (Signed)
Shift summary:  - Patient still requiring HFNC at 55L, FiO2 down to 90%.

## 2019-11-13 NOTE — Plan of Care (Signed)
  Problem: Education: Goal: Knowledge of risk factors and measures for prevention of condition will improve Outcome: Not Progressing   Problem: Coping: Goal: Psychosocial and spiritual needs will be supported Outcome: Not Progressing   Problem: Respiratory: Goal: Will maintain a patent airway Outcome: Not Progressing Goal: Complications related to the disease process, condition or treatment will be avoided or minimized Outcome: Not Progressing   

## 2019-11-13 NOTE — Progress Notes (Addendum)
PROGRESS NOTE    Angel Campbell  GNF:[id number] DOB: Sep 01, 1970 DOA: 11/11/2019 PCP: Marygrace Drought, MD   Brief Narrative:  Angel Campbell is a 49 y.o. 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 Patient is unvaccinated and has had exposure to someone with COVID-19 infection She took her Covid self test at home on 11/08/19 which was positive Upon arrival to the ER she was noted to have room air pulse oximetry of 64% and was a started on a nonrebreather mask at 15 L.   Arterial blood gas pH 7.4, PCO2 46, PO2 37 O2 sat 71 Sodium 132, potassium 3.3, chloride 96, bicarb 24, glucose 325, BUN 8, creatinine 0.72, alk phos 69, AST 66, ALT 45, LDH 485, ferritin 742, white count 3.5, hemoglobin 12.9, hematocrit 36.7, MCV 84.8, RDW 13.6, fibrinogen 551, fibrin derivatives 1359. Chest x-ray  shows patchy bilateral airspace infiltrates consistent with multifocal pneumonia.  Patient was started under hospitalist service with acute hypoxic respiratory failure secondary to COVID-19 pneumonia.  She was ruled out of PE based on CT angiogram of the chest.  She was started on IV Solu-Medrol, remdesivir and baricitinib.  Assessment & Plan:   Principal Problem:   Pneumonia due to COVID-19 virus Active Problems:   DVT, bilateral lower limbs (HCC)   Obesity, Class III, BMI 40-49.9 (morbid obesity) (HCC)   Acute respiratory failure due to COVID-19 [facility])   COPD (chronic obstructive pulmonary disease) (HCC)   Hypokalemia  Acute hypoxic respiratory failure secondary to COVID-19 pneumonia: Patient was found to have room air pulse oximetry of 64%. She was initially tried on 6 L of oxygen via nasal cannula with pulse oximetry in the high  80s so she was placed on a nonrebreather mask and nasal cannula maintaining pulse oximetry greater than 92%.  She was ruled out of PE based on CT angiogram of chest.  She continues to feel better but she remains in 100% of FiO2 on high flow oxygen.  Per RN, her oxygen saturation is hovering around 90% but when I saw her, she was ranging anywhere from 95 to 98%. Continue following IV Solu-Medrol Remdesivir per protocol Baricitinib,  Supportive care with bronchodilator therapy and antitussives.  Zinc and vitamin C. Patient was encouraged to prone, out of bed to chair, to use incentive spirometry and flutter valve. I was informed by RN that chest x-ray today shows possibility of pneumomediastinum.  PCCM on board already.  Advised him to inform PCCM.  Patient received 1 dose of IV Lasix yesterday.  We will try another dose of IV Lasix 40 mg today.  Poorly controlled newly diagnosed type 2 diabetes mellitus with hyperglycemia: Hemoglobin A1c 10.0.  Previously nondiabetic.  Remains hyperglycemic.  Will increase Levemir to 40 units twice daily and continue SSI.  Hypokalemia: Resolved  Morbid obesity (BMI 44) Complicates overall prognosis and care.  Weight loss encouraged.  History of venous thromboembolic disease Patient has a history of DVT and PE and is on chronic anticoagulation therapy with Coumadin.  INR now therapeutic.  Continue Coumadin dose per pharmacy.  COPD: Stable and not in acute exacerbation  DVT prophylaxis:    Code Status: Full Code  Family Communication:  None present at bedside.  Plan of care discussed with patient in length and he  verbalized understanding and agreed with it.  She is updating her son daily.  Status is: Inpatient  Remains inpatient appropriate because:Hemodynamically unstable   Dispo: The patient is from: Home              Anticipated d/c is to: Home              Anticipated d/c date is: > 3 days              Patient currently is not medically stable  to d/c.        Estimated body mass index is 41.15 kg/m as calculated from the following:   Height as of this encounter: 5' 9"  (1.753 m).   Weight as of this encounter: 126.4 kg.      Nutritional status:               Consultants:   Dr. Mortimer Fries  Procedures:   None  Antimicrobials:  Anti-infectives (From admission, onward)   Start     Dose/Rate Route Frequency Ordered Stop   11/10/19 1000  remdesivir 100 mg in sodium chloride 0.9 % 100 mL IVPB       "Followed by" Linked Group Details   100 mg 200 mL/hr over 30 Minutes Intravenous Daily 11/17/2019 1426 11/14/19 0959   11/23/2019 1500  remdesivir 200 mg in sodium chloride 0.9% 250 mL IVPB       "Followed by" Linked Group Details   200 mg 580 mL/hr over 30 Minutes Intravenous Once 10/31/2019 1426 11/20/2019 1645   10/27/2019 1215  cefTRIAXone (ROCEPHIN) 2 g in sodium chloride 0.9 % 100 mL IVPB        2 g 200 mL/hr over 30 Minutes Intravenous  Once 11/03/2019 1201 11/22/2019 1408   11/22/2019 1215  azithromycin (ZITHROMAX) 500 mg in sodium chloride 0.9 % 250 mL IVPB        500 mg 250 mL/hr over 60 Minutes Intravenous  Once 10/26/2019 1201 11/01/2019 1447         Subjective: Seen and examined.  Despite of being on 100% FiO2 on high flow, she states that she feels better.  She is able to speak in full sentences.  Looks comfortable.  No other complaint.  Objective: Vitals:   11/13/19 0700 11/13/19 0800 11/13/19 0900 11/13/19 0918  BP: 109/69 106/71 101/67   Pulse: 72 74 79   Resp: (!) 24 (!) 25 19   Temp:  97.6 F (36.4 C)    TempSrc:  Axillary    SpO2: (!) 89% 91% 98% 94%  Weight:      Height:        Intake/Output Summary (Last 24 hours) at 11/13/2019 1054 Last data filed at 11/13/2019 0000 Gross per 24 hour  Intake 197.28 ml  Output 2575 ml  Net -2377.72 ml   Filed Weights   11/22/2019 1154 11/11/19 0300  Weight: 136.1 kg 126.4 kg    Examination:  General exam: Appears calm and comfortable, morbidly  obese Respiratory system: Diminished breath sounds globally. Respiratory effort normal. Cardiovascular system: S1 & S2 heard, RRR. No JVD, murmurs, rubs, gallops or clicks.  Trace pitting edema bilateral lower extremity Gastrointestinal system: Abdomen is nondistended, soft and nontender. No organomegaly or masses felt. Normal bowel sounds heard. Central nervous system: Alert and oriented. No focal neurological deficits. Extremities: Symmetric 5 x 5 power. Skin: No rashes, lesions or ulcers.   Data Reviewed: I have personally reviewed following labs and imaging studies  CBC: Recent Labs  Lab 11/12/2019 1208 11/10/19 0342 11/11/19 0539 11/12/19 0718 11/13/19 0734  WBC 3.5* 3.9* 7.7 7.0 8.6  NEUTROABS 2.7 3.1 6.7 5.9 7.1  HGB 12.9 9.6* 13.3 13.6 14.5  HCT 36.7 29.0* 39.2 40.7 42.7  MCV 84.8 88.7 86.9 86.8 85.6  PLT 183 263 278 323 388   Basic Metabolic Panel: Recent Labs  Lab 10/27/2019 1208 11/10/19 0342 11/11/19 0539 11/12/19 0718 11/13/19 0734  NA 132* 138 139 139 138  K 3.3* 4.0 4.3 4.2 3.7  CL 96* 103 101 101 99  CO2 24 25 28 27 29   GLUCOSE 325* 282* 232* 252* 247*  BUN 8 15 20 20 20   CREATININE 0.72 0.59 0.53 0.74 0.56  CALCIUM 7.8* 7.9* 8.3* 8.0* 8.2*  MG  --  2.4 2.5* 2.4 2.4  PHOS  --  2.9 2.6 3.7 3.2   GFR: Estimated Creatinine Clearance: 118.6 mL/min (by C-G formula based on SCr of 0.56 mg/dL). Liver Function Tests: Recent Labs  Lab 11/06/2019 1208 11/10/19 0342 11/11/19 0539 11/12/19 0718 11/13/19 0734  AST 66* 60* 57* 47* 36  ALT 45* 40 35 32 29  ALKPHOS 69 68 69 77 79  BILITOT 0.8 0.7 0.7 0.8 0.8  PROT 7.1 7.0 7.1 6.9 7.1  ALBUMIN 3.2* 3.0* 3.0* 2.9* 3.0*   No results for input(s): LIPASE, AMYLASE in the last 168 hours. No results for input(s): AMMONIA in the last 168 hours. Coagulation Profile: Recent Labs  Lab 11/08/2019 1208 11/10/19 0342 11/11/19 0539 11/12/19 0718 11/13/19 0734  INR 1.6* 1.8* 1.4* 2.0* 2.3*   Cardiac Enzymes: No  results for input(s): CKTOTAL, CKMB, CKMBINDEX, TROPONINI in the last 168 hours. BNP (last 3 results) No results for input(s): PROBNP in the last 8760 hours. HbA1C: No results for input(s): HGBA1C in the last 72 hours. CBG: Recent Labs  Lab 11/12/19 0801 11/12/19 1156 11/12/19 1557 11/12/19 2238 11/13/19 0731  GLUCAP 260* 280* 259* 286* 238*   Lipid Profile: No results for input(s): CHOL, HDL, LDLCALC, TRIG, CHOLHDL, LDLDIRECT in the last 72 hours. Thyroid Function Tests: No results for input(s): TSH, T4TOTAL, FREET4, T3FREE, THYROIDAB in the last 72 hours. Anemia Panel: Recent Labs    11/12/19 0718 11/13/19 0734  FERRITIN 1,032* 804*   Sepsis Labs: Recent Labs  Lab 11/22/2019 1208  PROCALCITON 0.21    Recent Results (from the past 240 hour(s))  SARS Coronavirus 2 by RT PCR (hospital order, performed in [facility] hospital lab) Nasopharyngeal Nasopharyngeal Swab     Status: Abnormal   Collection Time: 10/30/2019 12:08 PM   Specimen: Nasopharyngeal Swab  Result Value Ref Range Status   SARS Coronavirus 2 POSITIVE (A) NEGATIVE Final    Comment: RESULT CALLED TO, READ BACK BY AND VERIFIED WITH: CHRISTINE HALLAS @1348  10/29/2019 MJU (NOTE) SARS-CoV-2 target nucleic acids are DETECTED  SARS-CoV-2 RNA is generally detectable in upper respiratory specimens  during the acute phase of infection.  Positive results are indicative  of the presence of the identified virus, but do not rule out bacterial infection or co-infection with other pathogens not detected by the test.  Clinical correlation with patient history and  other diagnostic information is necessary to determine patient infection status.  The expected result is negative.  Fact Sheet for Patients:   StrictlyIdeas.no   Fact Sheet for Healthcare Providers:   BankingDealers.co.za    This test is not yet approved or cleared by the [place] FDA and  has been authorized  for detection and/or diagnosis of SARS-CoV-2  by FDA under an Emergency Use Authorization (EUA).  This EUA will remain in effect (meaning this te st can be used) for the duration of  the COVID-19 declaration under Section 564(b)(1) of the Act, 21 U.S.C. section 360-bbb-3(b)(1), unless the authorization is terminated or revoked sooner.  Performed at [facility], [address], [place], [place] [address]       Radiology Studies: [facility] Chest [place] 1 View  Result Date: 11/13/2019 CLINICAL DATA:  Acute respiratory failure. EXAM: PORTABLE CHEST 1 VIEW COMPARISON:  11/12/2019. FINDINGS: Unchanged cardiac enlargement. Thin lucency along the superior left heart border is identified. Cannot rule out pneumomediastinum. Unchanged appearance of diffuse interstitial airspace opacities compatible with IMPRESSION: 1. Subtle lucency along the superior left heart border is identified which may be seen with pneumomediastinum. 2. No change in the appearance of diffuse interstitial and airspace densities compatible with COVID pneumonia. 3. These results will be called to the ordering clinician or representative by the Radiologist Assistant, and communication documented in the PACS or [employer]. Electronically Signed   By: Kerby Moors M.D.   On: 11/13/2019 08:24   DG Chest Port 1 View  Result Date: 11/12/2019 CLINICAL DATA:  Acute respiratory failure with hypoxia. COVID positive EXAM: PORTABLE CHEST 1 VIEW COMPARISON:  10/30/2019 FINDINGS: Mild cardiac enlargement. Decreased lung volumes. Diffuse bilateral interstitial and airspace densities appear unchanged from previous exam. No new findings. IMPRESSION: 1. No change in aeration to the lungs compared to previous exam. 2. Stable appearance of bilateral interstitial and airspace densities. Electronically Signed   By: Kerby Moors M.D.   On: 11/12/2019 06:11    Scheduled Meds: . vitamin C  500 mg Oral Daily  . baricitinib  4 mg Oral Daily  .  Chlorhexidine Gluconate Cloth  6 each Topical Daily  . docusate sodium  200 mg Oral BID  . insulin aspart  0-15 Units Subcutaneous Q4H  . insulin aspart  4 Units Subcutaneous TID WC  . insulin detemir  30 Units Subcutaneous BID  . predniSONE  50 mg Oral Daily  . sodium chloride flush  3 mL Intravenous Q12H  . warfarin  5 mg Oral ONCE-1600  . Warfarin - Pharmacist Dosing Inpatient   Does not apply q1600  . zinc sulfate  220 mg Oral Daily   Continuous Infusions: . sodium chloride Stopped (11/12/19 1608)  . remdesivir 100 mg in NS 100 mL Stopped (11/12/19 1121)     LOS: 4 days   Time spent: 31 minutes   Darliss Cheney, MD [facility]  11/13/2019, 10:54 AM   To contact the attending provider between 7A-7P or the covering provider during after hours 7P-7A, please log into the web site [email or web address].

## 2019-11-13 NOTE — Consult Note (Signed)
ANTICOAGULATION CONSULT NOTE  Pharmacy Consult for Warfarin Indication: History of VTE  Patient Measurements: Height: 5\' 9"  (175.3 cm) Weight: 126.4 kg (278 lb 10.6 oz) IBW/kg (Calculated) : 66.2  Vital Signs: Temp: 97.6 F (36.4 C) (09/19 0800) Temp Source: Axillary (09/19 0800) BP: 106/78 (09/19 0600) Pulse Rate: 64 (09/19 0600)  Labs: Recent Labs    11/11/19 0539 11/11/19 0539 11/12/19 0718 11/13/19 0734  HGB 13.3   < > 13.6 14.5  HCT 39.2  --  40.7 42.7  PLT 278  --  323 334  LABPROT 16.7*  --  21.6* 24.6*  INR 1.4*  --  2.0* 2.3*  CREATININE 0.53  --  0.74 0.56   < > = values in this interval not displayed.    Estimated Creatinine Clearance: 118.6 mL/min (by C-G formula based on SCr of 0.56 mg/dL).   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 Pharmacy has been consulted to manage warfarin while inpatient.   Hgb 12.9 dropped to 9.6 on 9/16; no apparent signs/symptoms of bleeding. 9/17 Hgb recovered to 13.3. MD is okay to continue warfarin today and also starting her on therapeutic lovenox.   Date INR Plan  9/14 Unk Last home dose  9/15 1.6 Not resumed  9/16 1.8 Hold  9/17 1.4 10 mg  9/18 2.0 5 mg  9/19 2.3     Goal of Therapy:  INR 2-3   Plan:  INR therapeutic. Will continue patient's home regimen and give warfarin 5 mg PO x1 tonight. Continue to monitor daily INR and CBC in AM  Pharmacy will continue to follow.   10/19, PharmD, BCPS 11/13/2019 8:40 AM

## 2019-11-14 LAB — COMPREHENSIVE METABOLIC PANEL
ALT: 31 U/L (ref 0–44)
AST: 46 U/L — ABNORMAL HIGH (ref 15–41)
Albumin: 3.1 g/dL — ABNORMAL LOW (ref 3.5–5.0)
Alkaline Phosphatase: 79 U/L (ref 38–126)
Anion gap: 12 (ref 5–15)
BUN: 22 mg/dL — ABNORMAL HIGH (ref 6–20)
CO2: 30 mmol/L (ref 22–32)
Calcium: 8 mg/dL — ABNORMAL LOW (ref 8.9–10.3)
Chloride: 98 mmol/L (ref 98–111)
Creatinine, Ser: 0.65 mg/dL (ref 0.44–1.00)
GFR calc Af Amer: >60 mL/min (ref >60)
GFR calc non Af Amer: >60 mL/min (ref >60)
Glucose, Bld: 94 mg/dL (ref 70–99)
Potassium: 3.2 mmol/L — ABNORMAL LOW (ref 3.5–5.1)
Sodium: 140 mmol/L (ref 135–145)
Total Bilirubin: 0.9 mg/dL (ref 0.3–1.2)
Total Protein: 7.3 g/dL (ref 6.5–8.1)

## 2019-11-14 LAB — CBC WITH DIFFERENTIAL/PLATELET
Abs Immature Granulocytes: 0.4 10*3/uL — ABNORMAL HIGH (ref 0.00–0.07)
Basophils Absolute: 0 10*3/uL (ref 0.0–0.1)
Basophils Relative: 0 %
Eosinophils Absolute: 0 10*3/uL (ref 0.0–0.5)
Eosinophils Relative: 0 %
HCT: 44.3 % (ref 36.0–46.0)
Hemoglobin: 14.8 g/dL (ref 12.0–15.0)
Immature Granulocytes: 4 %
Lymphocytes Relative: 12 %
Lymphs Abs: 1.2 10*3/uL (ref 0.7–4.0)
MCH: 29.3 pg (ref 26.0–34.0)
MCHC: 33.4 g/dL (ref 30.0–36.0)
MCV: 87.7 fL (ref 80.0–100.0)
Monocytes Absolute: 0.4 10*3/uL (ref 0.1–1.0)
Monocytes Relative: 4 %
Neutro Abs: 8.4 10*3/uL — ABNORMAL HIGH (ref 1.7–7.7)
Neutrophils Relative %: 80 %
Platelets: 355 10*3/uL (ref 150–400)
RBC: 5.05 MIL/uL (ref 3.87–5.11)
RDW: 13.3 % (ref 11.5–15.5)
WBC: 10.4 10*3/uL (ref 4.0–10.5)
nRBC: 0.2 % (ref 0.0–0.2)

## 2019-11-14 LAB — GLUCOSE, CAPILLARY
Glucose-Capillary: 120 mg/dL — ABNORMAL HIGH (ref 70–99)
Glucose-Capillary: 225 mg/dL — ABNORMAL HIGH (ref 70–99)
Glucose-Capillary: 239 mg/dL — ABNORMAL HIGH (ref 70–99)
Glucose-Capillary: 267 mg/dL — ABNORMAL HIGH (ref 70–99)
Glucose-Capillary: 289 mg/dL — ABNORMAL HIGH (ref 70–99)
Glucose-Capillary: 88 mg/dL (ref 70–99)

## 2019-11-14 LAB — C-REACTIVE PROTEIN: CRP: 0.6 mg/dL (ref <1.0)

## 2019-11-14 LAB — MAGNESIUM: Magnesium: 2.4 mg/dL (ref 1.7–2.4)

## 2019-11-14 LAB — PHOSPHORUS: Phosphorus: 3 mg/dL (ref 2.5–4.6)

## 2019-11-14 LAB — FERRITIN: Ferritin: 816 ng/mL — ABNORMAL HIGH (ref 11–307)

## 2019-11-14 LAB — FIBRIN DERIVATIVES D-DIMER (ARMC ONLY): Fibrin derivatives D-dimer (ARMC): 1359.44 ng/mL (FEU) — ABNORMAL HIGH (ref 0.00–499.00)

## 2019-11-14 LAB — PROTIME-INR
INR: 2.6 — ABNORMAL HIGH (ref 0.8–1.2)
Prothrombin Time: 27.1 seconds — ABNORMAL HIGH (ref 11.4–15.2)

## 2019-11-14 MED ORDER — MORPHINE SULFATE (PF) 2 MG/ML IV SOLN: 2.0000 mg | Freq: Once | INTRAVENOUS | Status: AC

## 2019-11-14 MED ORDER — POTASSIUM CHLORIDE 10 MEQ/100ML IV SOLN
10.0000 meq | INTRAVENOUS | Status: DC
  Filled 2019-11-14 (×6): qty 100

## 2019-11-14 MED ORDER — MORPHINE SULFATE (PF) 2 MG/ML IV SOLN
INTRAVENOUS | Status: AC
  Administered 2019-11-14: 2 mg via INTRAVENOUS
  Filled 2019-11-14: qty 1

## 2019-11-14 MED ORDER — MIDAZOLAM HCL 2 MG/2ML IJ SOLN: 4.0000 mg | Freq: Once | INTRAMUSCULAR | Status: DC

## 2019-11-14 MED ORDER — POTASSIUM CHLORIDE CRYS ER 20 MEQ PO TBCR
40.0000 meq | EXTENDED_RELEASE_TABLET | ORAL | Status: AC
  Administered 2019-11-14 (×2): 40 meq via ORAL
  Filled 2019-11-14: qty 2

## 2019-11-14 MED ORDER — MORPHINE SULFATE (PF) 2 MG/ML IV SOLN
2.0000 mg | INTRAVENOUS | Status: DC | PRN
  Administered 2019-11-15 (×3): 2 mg via INTRAVENOUS
  Filled 2019-11-14 (×3): qty 1

## 2019-11-14 MED ORDER — FUROSEMIDE 10 MG/ML IJ SOLN
40.0000 mg | Freq: Once | INTRAMUSCULAR | Status: AC
  Administered 2019-11-14: 40 mg via INTRAVENOUS
  Filled 2019-11-14: qty 4

## 2019-11-14 MED ORDER — VECURONIUM BROMIDE 10 MG IV SOLR: 20.0000 mg | Freq: Once | INTRAVENOUS | Status: DC

## 2019-11-14 MED ORDER — WARFARIN SODIUM 3 MG PO TABS
3.0000 mg | ORAL_TABLET | Freq: Once | ORAL | Status: AC
  Administered 2019-11-14: 3 mg via ORAL
  Filled 2019-11-14: qty 1

## 2019-11-14 MED ORDER — FENTANYL 2500MCG IN NS 250ML (10MCG/ML) PREMIX INFUSION: 0.0000 ug/h | INTRAVENOUS | Status: DC

## 2019-11-14 MED ORDER — FENTANYL CITRATE (PF) 100 MCG/2ML IJ SOLN: 200.0000 ug | Freq: Once | INTRAMUSCULAR | Status: DC

## 2019-11-14 NOTE — Consult Note (Signed)
ANTICOAGULATION CONSULT NOTE  Pharmacy Consult for Warfarin Indication: History of VTE  Patient Measurements: Height: 5\' 9"  (175.3 cm) Weight: 126.4 kg (278 lb 10.6 oz) IBW/kg (Calculated) : 66.2  Vital Signs: Temp: 100.5 F (38.1 C) (09/20 1200) Temp Source: Axillary (09/20 1200) BP: 107/62 (09/20 0800) Pulse Rate: 83 (09/20 0800)  Labs: Recent Labs    11/12/19 0718 11/12/19 0718 11/13/19 0734 11/14/19 0532  HGB 13.6   < > 14.5 14.8  HCT 40.7  --  42.7 44.3  PLT 323  --  334 355  LABPROT 21.6*  --  24.6* 27.1*  INR 2.0*  --  2.3* 2.6*  CREATININE 0.74  --  0.56 0.65   < > = values in this interval not displayed.    Estimated Creatinine Clearance: 118.6 mL/min (by C-G formula based on SCr of 0.65 mg/dL).   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 Pharmacy has been consulted to manage warfarin while inpatient.   Hgb 12.9 dropped to 9.6 on 9/16; no apparent signs/symptoms of bleeding. 9/17 Hgb recovered to 13.3. MD is okay to continue warfarin today and also starting her on therapeutic lovenox, which has been discontinued following therapeutic INRs.  Date INR Plan  9/14 Unk Last home dose  9/15 1.6 Not resumed  9/16 1.8 Hold  9/17 1.4 10 mg  9/18 2.0 5 mg  9/19 2.3 5 mg  9/20 2.6     Goal of Therapy:  INR 2-3   Plan:  INR with pretty steady increase over past few days following 10 mg dose. Will give slightly reduced dose of 3 mg today and then can likely continue home dose pending stabilization of INR.  Pharmacy will continue to follow.   10/20, PharmD 11/14/2019 12:22 PM

## 2019-11-14 NOTE — Progress Notes (Signed)
PROGRESS NOTE    Angel Campbell  YYQ:[id number] DOB: 1970-11-07 DOA: 10/26/2019 PCP: Marygrace Drought, MD   Brief Narrative:  Angel Campbell is a 49 y.o. 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 Patient is unvaccinated and has had exposure to someone with COVID-19 infection She took her Covid self test at home on 11/08/19 which was positive Upon arrival to the ER she was noted to have room air pulse oximetry of 64% and was a started on a nonrebreather mask at 15 L.   Arterial blood gas pH 7.4, PCO2 46, PO2 37 O2 sat 71 Sodium 132, potassium 3.3, chloride 96, bicarb 24, glucose 325, BUN 8, creatinine 0.72, alk phos 69, AST 66, ALT 45, LDH 485, ferritin 742, white count 3.5, hemoglobin 12.9, hematocrit 36.7, MCV 84.8, RDW 13.6, fibrinogen 551, fibrin derivatives 1359. Chest x-ray  shows patchy bilateral airspace infiltrates consistent with multifocal pneumonia.  Patient was started under hospitalist service with acute hypoxic respiratory failure secondary to COVID-19 pneumonia.  She was ruled out of PE based on CT angiogram of the chest.  She was started on IV Solu-Medrol, remdesivir and baricitinib.  Assessment & Plan:   Principal Problem:   Pneumonia due to COVID-19 virus Active Problems:   DVT, bilateral lower limbs (HCC)   Obesity, Class III, BMI 40-49.9 (morbid obesity) (HCC)   Acute respiratory failure due to COVID-19 [facility])   COPD (chronic obstructive pulmonary disease) (HCC)   Hypokalemia  Acute hypoxic respiratory failure secondary to COVID-19 pneumonia: Patient was found to have room air pulse oximetry of 64%. She was initially tried on 6 L of oxygen via nasal cannula with pulse oximetry in the high  80s so she was placed on a nonrebreather mask and nasal cannula maintaining pulse oximetry greater than 92%.  She was ruled out of PE based on CT angiogram of chest. Continues to feel well as far as her breathing goes but she feels weak a little bit today.  Now oxygen demand has gone down and she is on 60% of high flow.  Chest x-ray yesterday shows no changes.  Received 1 dose of Lasix yesterday and will repeat 40 mg IV Lasix again today. Completed remdesivir on 11/13/2019 Continue following IV Solu-Medrol Baricitinib Bronchodilators. Patient was encouraged to prone, out of bed to chair, to use incentive spirometry and flutter valve.  Poorly controlled newly diagnosed type 2 diabetes mellitus with hyperglycemia: Hemoglobin A1c 10.0.  Previously nondiabetic.  Blood sugar very well controlled.  Continue Levemir 30 units twice daily and SSI.  Hypokalemia: 3.2 again today.  Will replace.  Recheck in the morning.  Morbid obesity (BMI 44) Complicates overall prognosis and care.  Weight loss encouraged.  History of venous thromboembolic disease Patient has a history of DVT and PE and is on chronic anticoagulation therapy with Coumadin.  INR now therapeutic.  We will discontinue Lovenox.  COPD: Stable and not in acute exacerbation  DVT prophylaxis:    Code Status: Full Code  Family Communication:  None present at bedside.  Plan of care discussed with patient in length and he verbalized understanding and agreed with it.  Status is: Inpatient  Remains inpatient appropriate because:Hemodynamically unstable   Dispo: The patient is from: Home  Anticipated d/c is to: Home              Anticipated d/c date is: > 3 days              Patient currently is not medically stable to d/c.        Estimated body mass index is 41.15 kg/m as calculated from the following:   Height as of this encounter: _0  (1.753 m).   Weight as of this encounter: 126.4 kg.      Nutritional  status:               Consultants:   Dr. Mortimer Fries  Procedures:   None  Antimicrobials:  Anti-infectives (From admission, onward)   Start     Dose/Rate Route Frequency Ordered Stop   11/10/19 1000  remdesivir 100 mg in sodium chloride 0.9 % 100 mL IVPB       "Followed by" Linked Group Details   100 mg 200 mL/hr over 30 Minutes Intravenous Daily 11/07/2019 1426 11/13/19 1159   11/05/2019 1500  remdesivir 200 mg in sodium chloride 0.9% 250 mL IVPB       "Followed by" Linked Group Details   200 mg 580 mL/hr over 30 Minutes Intravenous Once 11/08/2019 1426 11/10/2019 1645   11/10/2019 1215  cefTRIAXone (ROCEPHIN) 2 g in sodium chloride 0.9 % 100 mL IVPB        2 g 200 mL/hr over 30 Minutes Intravenous  Once 11/01/2019 1201 10/30/2019 1408   10/26/2019 1215  azithromycin (ZITHROMAX) 500 mg in sodium chloride 0.9 % 250 mL IVPB        500 mg 250 mL/hr over 60 Minutes Intravenous  Once 11/14/2019 1201 11/02/2019 1447         Subjective: Seen and examined.  She states that her breathing is slightly better than yesterday but she feels weak.  She has no other complaint.  Objective: Vitals:   11/14/19 0600 11/14/19 0700 11/14/19 0800 11/14/19 0900  BP: 97/64 100/60 107/62   Pulse: 66 70 83   Resp: (!) 26 (!) 25 (!) 26   Temp:   98.2 F (36.8 C) 100.1 F (37.8 C)  TempSrc:   Oral Axillary  SpO2: 92% 97% 97%   Weight:      Height:        Intake/Output Summary (Last 24 hours) at 11/14/2019 1010 Last data filed at 11/14/2019 7341 Gross per 24 hour  Intake 440 ml  Output 2160 ml  Net -1720 ml   Filed Weights   10/29/2019 1154 11/11/19 0300  Weight: 136.1 kg 126.4 kg    Examination:  General exam: Appears calm and comfortable, morbidly obese Respiratory system: Clear to auscultation. Respiratory effort normal. Cardiovascular system: S1 & S2 heard, RRR. No JVD, murmurs, rubs, gallops or clicks.  Trace pitting edema bilateral lower extremity Gastrointestinal system: Abdomen is  nondistended, soft and nontender. No organomegaly or masses felt. Normal bowel sounds heard. Central nervous system: Alert and oriented. No focal neurological deficits. Extremities: Symmetric 5 x 5 power. Skin: No rashes, lesions or ulcers.  Psychiatry: Judgement and insight appear normal. Mood & affect appropriate.     Data Reviewed: I have personally reviewed following labs and imaging studies  CBC: Recent Labs  Lab 11/10/19 0342 11/11/19 0539 11/12/19 0718 11/13/19 0734 11/14/19 0532  WBC 3.9* 7.7 7.0 8.6 10.4  NEUTROABS 3.1 6.7 5.9 7.1 8.4*  HGB 9.6* 13.3 13.6 14.5 14.8  HCT 29.0* 39.2 40.7 42.7 44.3  MCV 88.7 86.9 86.8 85.6 87.7  PLT 263 278 323 334 993   Basic Metabolic Panel: Recent Labs  Lab 11/10/19 0342 11/11/19 0539 11/12/19 0718 11/13/19 0734 11/14/19 0532  NA 138 139 139 138 140  K 4.0 4.3 4.2 3.7 3.2*  CL 103 101 101 99 98  CO2 _0 GLUCOSE 282* 232* 252* 247* 94  BUN _1 22*  CREATININE 0.59 0.53 0.74 0.56 0.65  CALCIUM 7.9* 8.3* 8.0* 8.2* 8.0*  MG 2.4 2.5* 2.4 2.4 2.4  PHOS 2.9 2.6 3.7 3.2 3.0   GFR: Estimated Creatinine Clearance: 118.6 mL/min (by C-G formula based on SCr of 0.65 mg/dL). Liver Function Tests: Recent Labs  Lab 11/10/19 0342 11/11/19 0539 11/12/19 0718 11/13/19 0734 11/14/19 0532  AST 60* 57* 47* 36 46*  ALT 40 35 32 29 31  ALKPHOS 68 69 77 79 79  BILITOT 0.7 0.7 0.8 0.8 0.9  PROT 7.0 7.1 6.9 7.1 7.3  ALBUMIN 3.0* 3.0* 2.9* 3.0* 3.1*   No results for input(s): LIPASE, AMYLASE in the last 168 hours. No results for input(s): AMMONIA in the last 168 hours. Coagulation Profile: Recent Labs  Lab 11/10/19 0342 11/11/19 0539 11/12/19 0718 11/13/19 0734 11/14/19 0532  INR 1.8* 1.4* 2.0* 2.3* 2.6*   Cardiac Enzymes: No results for input(s): CKTOTAL, CKMB, CKMBINDEX, TROPONINI in the last 168 hours. BNP (last 3 results) No results for input(s): PROBNP in the last 8760 hours. HbA1C: No results for  input(s): HGBA1C in the last 72 hours. CBG: Recent Labs  Lab 11/13/19 1600 11/13/19 2010 11/14/19 0006 11/14/19 0348 11/14/19 0720  GLUCAP 229* 283* 225* 120* 88   Lipid Profile: No results for input(s): CHOL, HDL, LDLCALC, TRIG, CHOLHDL, LDLDIRECT in the last 72 hours. Thyroid Function Tests: No results for input(s): TSH, T4TOTAL, FREET4, T3FREE, THYROIDAB in the last 72 hours. Anemia Panel: Recent Labs    11/13/19 0734 11/14/19 0532  FERRITIN 804* 816*   Sepsis Labs: Recent Labs  Lab 11/21/2019 1208  PROCALCITON 0.21    Recent Results (from the past 240 hour(s))  SARS Coronavirus 2 by RT PCR (hospital order, performed in [facility] hospital lab) Nasopharyngeal Nasopharyngeal Swab     Status: Abnormal   Collection Time: 11/06/2019 12:08 PM   Specimen: Nasopharyngeal Swab  Result Value Ref Range Status   SARS Coronavirus 2 POSITIVE (A) NEGATIVE Final    Comment: RESULT CALLED TO, READ BACK BY AND VERIFIED WITH: CHRISTINE HALLAS _2  11/17/2019 MJU (NOTE) SARS-CoV-2 target nucleic acids are DETECTED  SARS-CoV-2 RNA is generally detectable in upper respiratory specimens  during the acute phase of infection.  Positive results are indicative  of the presence of the identified virus, but do not rule out bacterial infection or co-infection with other pathogens not detected by the test.  Clinical correlation with patient history and  other diagnostic information is necessary to determine patient infection status.  The expected result is negative.  Fact Sheet for Patients:   StrictlyIdeas.no   Fact Sheet for Healthcare Providers:   BankingDealers.co.za    This test is not yet approved or cleared by the [place] FDA and  has been authorized for detection and/or diagnosis of SARS-CoV-2 by FDA under an Emergency Use Authorization (EUA).  This EUA will remain in effect (meaning this te st can be used) for the duration of  the  COVID-19 declaration under Section 564(b)(1) of the Act, 21 U.S.C. section 360-bbb-3(b)(1), unless the authorization is  terminated or revoked sooner.  Performed at [facility], [address] [place], [place] [address]       Radiology Studies: [facility] Chest [place] 1 View  Result Date: 11/13/2019 CLINICAL DATA:  Acute respiratory failure. EXAM: PORTABLE CHEST 1 VIEW COMPARISON:  11/12/2019. FINDINGS: Unchanged cardiac enlargement. Thin lucency along the superior left heart border is identified. Cannot rule out pneumomediastinum. Unchanged appearance of diffuse interstitial airspace opacities compatible with IMPRESSION: 1. Subtle lucency along the superior left heart border is identified which may be seen with pneumomediastinum. 2. No change in the appearance of diffuse interstitial and airspace densities compatible with COVID pneumonia. 3. These results will be called to the ordering clinician or representative by the Radiologist Assistant, and communication documented in the PACS or [employer]. Electronically Signed   By: Kerby Moors M.D.   On: 11/13/2019 08:24    Scheduled Meds: . vitamin C  500 mg Oral Daily  . baricitinib  4 mg Oral Daily  . Chlorhexidine Gluconate Cloth  6 each Topical Daily  . docusate sodium  200 mg Oral BID  . furosemide  40 mg Intravenous Once  . insulin aspart  0-15 Units Subcutaneous Q4H  . insulin aspart  4 Units Subcutaneous TID WC  . insulin detemir  40 Units Subcutaneous BID  . potassium chloride  40 mEq Oral Q4H  . predniSONE  50 mg Oral Daily  . sodium chloride flush  3 mL Intravenous Q12H  . Warfarin - Pharmacist Dosing Inpatient   Does not apply q1600  . zinc sulfate  220 mg Oral Daily   Continuous Infusions: . sodium chloride Stopped (11/12/19 1608)     LOS: 5 days   Time spent: 31 minutes   Darliss Cheney, MD [facility]  11/14/2019, 10:10 AM   To contact the attending provider between 7A-7P or the covering provider  during after hours 7P-7A, please log into the web site [email or web address].

## 2019-11-15 ENCOUNTER — Inpatient Hospital Stay: Payer: Self-pay

## 2019-11-15 ENCOUNTER — Inpatient Hospital Stay: Payer: BC Managed Care – PPO

## 2019-11-15 DIAGNOSIS — J449 Chronic obstructive pulmonary disease, unspecified: Secondary | ICD-10-CM

## 2019-11-15 LAB — BLOOD GAS, ARTERIAL
Acid-Base Excess: 0.4 mmol/L (ref 0.0–2.0)
Acid-Base Excess: 3.5 mmol/L — ABNORMAL HIGH (ref 0.0–2.0)
Bicarbonate: 32.9 mmol/L — ABNORMAL HIGH (ref 20.0–28.0)
Bicarbonate: 32.6 mmol/L — ABNORMAL HIGH (ref 20.0–28.0)
FIO2: 1
FIO2: 0.6
MECHVT: 450 mL
MECHVT: 500 mL
O2 Saturation: 99.8 %
O2 Saturation: 95.1 %
PEEP: 12 cmH2O
PEEP: 8 cmH2O
Patient temperature: 37
Patient temperature: 37
RATE: 20 resp/min
RATE: 28 resp/min
pCO2 arterial: 99 mmHg (ref 32.0–48.0)
pCO2 arterial: 71 mmHg (ref 32.0–48.0)
pH, Arterial: 7.13 — CL (ref 7.350–7.450)
pH, Arterial: 7.27 — ABNORMAL LOW (ref 7.350–7.450)
pO2, Arterial: 273 mmHg — ABNORMAL HIGH (ref 83.0–108.0)
pO2, Arterial: 86 mmHg (ref 83.0–108.0)

## 2019-11-15 LAB — BASIC METABOLIC PANEL
Anion gap: 10 (ref 5–15)
BUN: 20 mg/dL (ref 6–20)
CO2: 27 mmol/L (ref 22–32)
Calcium: 7.9 mg/dL — ABNORMAL LOW (ref 8.9–10.3)
Chloride: 100 mmol/L (ref 98–111)
Creatinine, Ser: 0.62 mg/dL (ref 0.44–1.00)
GFR calc Af Amer: >60 mL/min (ref >60)
GFR calc non Af Amer: >60 mL/min (ref >60)
Glucose, Bld: 176 mg/dL — ABNORMAL HIGH (ref 70–99)
Potassium: 3.8 mmol/L (ref 3.5–5.1)
Sodium: 137 mmol/L (ref 135–145)

## 2019-11-15 LAB — C-REACTIVE PROTEIN: CRP: 5.5 mg/dL — ABNORMAL HIGH (ref <1.0)

## 2019-11-15 LAB — FERRITIN: Ferritin: 983 ng/mL — ABNORMAL HIGH (ref 11–307)

## 2019-11-15 LAB — GLUCOSE, CAPILLARY
Glucose-Capillary: 112 mg/dL — ABNORMAL HIGH (ref 70–99)
Glucose-Capillary: 154 mg/dL — ABNORMAL HIGH (ref 70–99)
Glucose-Capillary: 182 mg/dL — ABNORMAL HIGH (ref 70–99)
Glucose-Capillary: 99 mg/dL (ref 70–99)

## 2019-11-15 LAB — PROTIME-INR
INR: 2.4 — ABNORMAL HIGH (ref 0.8–1.2)
Prothrombin Time: 25.5 seconds — ABNORMAL HIGH (ref 11.4–15.2)

## 2019-11-15 LAB — PROCALCITONIN: Procalcitonin: 0.11 ng/mL

## 2019-11-15 MED ORDER — ROCURONIUM BROMIDE 50 MG/5ML IV SOLN
INTRAVENOUS | Status: AC
  Administered 2019-11-15: 50 mg via INTRAVENOUS
  Filled 2019-11-15: qty 1

## 2019-11-15 MED ORDER — ORAL CARE MOUTH RINSE
15.0000 mL | OROMUCOSAL | Status: DC
  Administered 2019-11-15 (×4): 15 mL via OROMUCOSAL

## 2019-11-15 MED ORDER — FENTANYL CITRATE (PF) 100 MCG/2ML IJ SOLN: 50.0000 ug | Freq: Once | INTRAMUSCULAR | Status: DC

## 2019-11-15 MED ORDER — PROPOFOL 1000 MG/100ML IV EMUL
INTRAVENOUS | Status: AC
  Administered 2019-11-15: 20 ug/kg/min via INTRAVENOUS
  Filled 2019-11-15: qty 100

## 2019-11-15 MED ORDER — FENTANYL 2500MCG IN NS 250ML (10MCG/ML) PREMIX INFUSION
INTRAVENOUS | Status: AC
  Administered 2019-11-15: 50 ug/h via INTRAVENOUS
  Filled 2019-11-15: qty 250

## 2019-11-15 MED ORDER — VITAL HIGH PROTEIN PO LIQD: 1000.0000 mL | ORAL | Status: DC

## 2019-11-15 MED ORDER — FENTANYL CITRATE (PF) 100 MCG/2ML IJ SOLN: 100.0000 ug | Freq: Once | INTRAMUSCULAR | Status: AC

## 2019-11-15 MED ORDER — MORPHINE 100MG IN NS 100ML (1MG/ML) PREMIX INFUSION
0.0000 mg/h | INTRAVENOUS | Status: DC
  Administered 2019-11-15: 5 mg/h via INTRAVENOUS
  Filled 2019-11-15: qty 100

## 2019-11-15 MED ORDER — MORPHINE BOLUS VIA INFUSION
5.0000 mg | INTRAVENOUS | Status: DC | PRN
  Filled 2019-11-15: qty 5

## 2019-11-15 MED ORDER — FENTANYL BOLUS VIA INFUSION
50.0000 ug | INTRAVENOUS | Status: DC | PRN
  Filled 2019-11-15: qty 50

## 2019-11-15 MED ORDER — FAMOTIDINE IN NACL 20-0.9 MG/50ML-% IV SOLN
20.0000 mg | Freq: Two times a day (BID) | INTRAVENOUS | Status: DC
  Administered 2019-11-15: 20 mg via INTRAVENOUS
  Filled 2019-11-15: qty 50

## 2019-11-15 MED ORDER — DEXTROSE 5 % IV SOLN: INTRAVENOUS | Status: DC

## 2019-11-15 MED ORDER — ACETAMINOPHEN 650 MG RE SUPP: 650.0000 mg | Freq: Four times a day (QID) | RECTAL | Status: DC | PRN

## 2019-11-15 MED ORDER — DOCUSATE SODIUM 50 MG/5ML PO LIQD
200.0000 mg | Freq: Two times a day (BID) | ORAL | Status: DC
  Administered 2019-11-15: 200 mg
  Filled 2019-11-15: qty 20

## 2019-11-15 MED ORDER — WARFARIN SODIUM 5 MG PO TABS
5.0000 mg | ORAL_TABLET | Freq: Once | ORAL | Status: DC
  Filled 2019-11-15: qty 1

## 2019-11-15 MED ORDER — METHYLPREDNISOLONE SODIUM SUCC 40 MG IJ SOLR
40.0000 mg | Freq: Two times a day (BID) | INTRAMUSCULAR | Status: DC
  Administered 2019-11-15: 40 mg via INTRAVENOUS
  Filled 2019-11-15: qty 1

## 2019-11-15 MED ORDER — ETOMIDATE 2 MG/ML IV SOLN
INTRAVENOUS | Status: AC
  Administered 2019-11-15: 20 mg via INTRAVENOUS
  Filled 2019-11-15: qty 10

## 2019-11-15 MED ORDER — POLYVINYL ALCOHOL 1.4 % OP SOLN
1.0000 [drp] | Freq: Four times a day (QID) | OPHTHALMIC | Status: DC | PRN
  Filled 2019-11-15: qty 15

## 2019-11-15 MED ORDER — ETOMIDATE 2 MG/ML IV SOLN: 20.0000 mg | Freq: Once | INTRAVENOUS | Status: AC

## 2019-11-15 MED ORDER — CHLORHEXIDINE GLUCONATE 0.12% ORAL RINSE (MEDLINE KIT)
15.0000 mL | Freq: Two times a day (BID) | OROMUCOSAL | Status: DC
  Administered 2019-11-15: 15 mL via OROMUCOSAL

## 2019-11-15 MED ORDER — ROCURONIUM BROMIDE 50 MG/5ML IV SOLN: 50.0000 mg | Freq: Once | INTRAVENOUS | Status: AC

## 2019-11-15 MED ORDER — MIDAZOLAM 50MG/50ML (1MG/ML) PREMIX INFUSION
0.5000 mg/h | INTRAVENOUS | Status: DC
  Administered 2019-11-15 (×3): 10 mg/h via INTRAVENOUS
  Filled 2019-11-15 (×3): qty 50

## 2019-11-15 MED ORDER — VECURONIUM BROMIDE 10 MG IV SOLR
INTRAVENOUS | Status: AC
  Administered 2019-11-15: 20 mg
  Filled 2019-11-15: qty 20

## 2019-11-15 MED ORDER — POLYETHYLENE GLYCOL 3350 17 G PO PACK
17.0000 g | PACK | Freq: Every day | ORAL | Status: DC
  Administered 2019-11-15: 17 g via ORAL
  Filled 2019-11-15: qty 1

## 2019-11-15 MED ORDER — MIDAZOLAM HCL 2 MG/2ML IJ SOLN: 2.0000 mg | INTRAMUSCULAR | Status: DC | PRN

## 2019-11-15 MED ORDER — MORPHINE SULFATE (PF) 2 MG/ML IV SOLN: 2.0000 mg | INTRAVENOUS | Status: DC | PRN

## 2019-11-15 MED ORDER — VECURONIUM BROMIDE 10 MG IV SOLR: 10.0000 mg | INTRAVENOUS | Status: DC | PRN

## 2019-11-15 MED ORDER — GLYCOPYRROLATE 0.2 MG/ML IJ SOLN: 0.2000 mg | INTRAMUSCULAR | Status: DC | PRN

## 2019-11-15 MED ORDER — SODIUM CHLORIDE 0.9% FLUSH: 10.0000 mL | Freq: Two times a day (BID) | INTRAVENOUS | Status: DC

## 2019-11-15 MED ORDER — FENTANYL 2500MCG IN NS 250ML (10MCG/ML) PREMIX INFUSION
50.0000 ug/h | INTRAVENOUS | Status: DC
  Administered 2019-11-15: 350 ug/h via INTRAVENOUS
  Filled 2019-11-15: qty 250

## 2019-11-15 MED ORDER — SODIUM CHLORIDE 0.9% FLUSH: 10.0000 mL | INTRAVENOUS | Status: DC | PRN

## 2019-11-15 MED ORDER — SODIUM CHLORIDE 0.9 % IV SOLN
2.0000 g | Freq: Three times a day (TID) | INTRAVENOUS | Status: DC
  Administered 2019-11-15: 2 g via INTRAVENOUS
  Filled 2019-11-15 (×4): qty 2

## 2019-11-15 MED ORDER — PROPOFOL 1000 MG/100ML IV EMUL: 0.0000 ug/kg/min | INTRAVENOUS | Status: DC

## 2019-11-15 MED ORDER — GLYCOPYRROLATE 1 MG PO TABS
1.0000 mg | ORAL_TABLET | ORAL | Status: DC | PRN
  Filled 2019-11-15: qty 1

## 2019-11-15 MED ORDER — DIPHENHYDRAMINE HCL 50 MG/ML IJ SOLN: 25.0000 mg | INTRAMUSCULAR | Status: DC | PRN

## 2019-11-15 MED ORDER — ACETAMINOPHEN 325 MG PO TABS: 650.0000 mg | ORAL_TABLET | Freq: Four times a day (QID) | ORAL | Status: DC | PRN

## 2019-11-15 MED ORDER — FENTANYL CITRATE (PF) 100 MCG/2ML IJ SOLN
INTRAMUSCULAR | Status: AC
  Administered 2019-11-15: 100 ug via INTRAVENOUS
  Filled 2019-11-15: qty 2

## 2019-11-16 NOTE — Progress Notes (Signed)
Pt expired @ 2357 ; pronuonced dead by 2 RN. Attending on call ,  Sister, and CDS notified. Will complete post- mortem.

## 2019-11-25 NOTE — Progress Notes (Signed)
Pt comfort care @ this time Fentanyl, Versed, & morphine sulfate gtt infusing . Pt is resting comfortably without any pain or distress. Family & Chaplain outside room, Pt ready for extubation per MD order ; Will continue to monitor situation closely.

## 2019-11-25 NOTE — Procedures (Signed)
Intubation Procedure Note  Angel Campbell  [id number]  [birth year]  Date:2019-12-03  Time:6:33 AM   Provider Performing:Rahmir Beever D Elvina Sidle    Procedure: Intubation (31500)  Indication(s) Respiratory Failure  Consent Unable to obtain consent due to emergent nature of procedure.   Anesthesia Etomidate, Fentanyl and Rocuronium   Time Out Verified patient identification, verified procedure, site/side was marked, verified correct patient position, special equipment/implants available, medications/allergies/relevant history reviewed, required imaging and test results available.   Sterile Technique Usual hand hygeine, masks, and gloves were used   Procedure Description Patient positioned in bed supine.  Sedation given as noted above.  Patient was intubated with endotracheal tube using Glidescope.  View was Grade 1 full glottis .  Number of attempts was 1.  Colorimetric CO2 detector was consistent with tracheal placement.   Complications/Tolerance None; patient tolerated the procedure well. Chest X-ray is ordered to verify placement.   EBL Minimal   Specimen(s) None    Size #8 ETT Secured at 23 cm at the lip    Harlon Ditty, AGACNP-BC [facility] Pager: [phone]

## 2019-11-25 NOTE — Progress Notes (Signed)
Initial Nutrition Assessment  DOCUMENTATION CODES:   Morbid obesity  INTERVENTION:  Initiate Vital High Protein at 15 mL/hr and advance by 20 mL/hr every 8 hours to goal rate of 75 mL/hr (1800 mL goal daily volume). Provides 1800 kcal, 158 grams of protein, 1512 mL H2O daily.  Monitor magnesium, potassium, and phosphorus daily for at least 3 days, MD to replete as needed, as pt is at risk for refeeding syndrome.  NUTRITION DIAGNOSIS:   Inadequate oral intake related to inability to eat as evidenced by NPO status.  GOAL:   Patient will meet greater than or equal to 90% of their needs  MONITOR:   Vent status, Labs, Weight trends, TF tolerance, I & O's  REASON FOR ASSESSMENT:   Ventilator, Consult Enteral/tube feeding initiation and management  ASSESSMENT:   49 year old female with PMHx of COPD admitted with COVID-19 PNA.  9/17 transferred to ICU 9/21 intubated  Patient is currently intubated on ventilator support MV: 14.3 L/min Temp (24hrs), Avg:99.1 F (37.3 C), Min:97.9 F (36.6 C), Max:99.7 F (37.6 C)  Medications reviewed and include: vitamin C 500 g daily, Colace 200 mg BID, Novolog 0-15 units Q4hrs, Novolog 4 units TID, Levemir 40 units BID, Solu-Medrol 40 mg Q12hrs IV, Miralax, warfarin, zonc sulfate 220 mg daily, cefepime, famotidine, fentanyl gtt, Versed.  Labs reviewed: CBG 154-182.  I/O: 700 mL UOP yesterday (0.2 mL/kg/hr)  Enteral Access: OGT; terminates in stomach per abdominal x-ray 9/21  Limited meal completion documentation in chart so unsure how well patient has been eating prior to intubation.  Discussed with RN and on rounds. Plan is to start tube feeds. Propofol was discontinued this AM.  NUTRITION - FOCUSED PHYSICAL EXAM:  Deferred.  Diet Order:   Diet Order            Diet NPO time specified  Diet effective now                EDUCATION NEEDS:   No education needs have been identified at this time  Skin:  Skin Assessment:  Reviewed RN Assessment  Last BM:  Unknown  Height:   Ht Readings from Last 1 Encounters:  10/26/2019 5\' 9"  (1.753 m)   Weight:   Wt Readings from Last 1 Encounters:  11/11/19 126.4 kg   Ideal Body Weight:  65.9 kg  BMI:  Body mass index is 41.15 kg/m.  Estimated Nutritional Needs:   Kcal:  1802  Protein:  160-170 grams  Fluid:  >/= 2 L/day  11/13/19, MS, RD, LDN Pager number available on Amion

## 2019-11-25 NOTE — Progress Notes (Signed)
Pt confused kept on pull of HFNC , became tachpenic , o2 sat @ 60's  Bipap initiated. Will continue to monitor pt closely. 0600 pt confused , desat in 40'swith increase WOB;  NP notified. Pt emergently  intubated @ 0630, Fentanyl & Propofol gtt initiated. Will continue to monitor pt closely.

## 2019-11-25 NOTE — Progress Notes (Signed)
GOALS OF CARE DISCUSSION  The Clinical status was relayed to family in detail. Sister Angel Campbell [phone]   Updated and notified of patients medical condition.  Patient remains unresponsive and will not open eyes to command.    patient with increased WOB and using accessory muscles to breathe Explained to family course of therapy and the modalities     Patient with Progressive multiorgan failure with high probability of very low chance of meaningful recovery despite all aggressive and optimal medical therapy. Patient is i with Suffering. The Sister has declared that  Patient WOULD NOT want to be placed on ventilator as the patient has expressed this emphatically.  Patient is NOT married, no parents, has one son who has mental disabilities. The next of kin to make decisions would be Angel Campbell the sister  Family understands the situation.  Sister Angel Campbell has consented and agreed to DNR/DNI and would like to proceed with Comfort care measures.  Family are satisfied with Plan of action and management. All questions answered  Additional CC time 32 mins   Angel Campbell Santiago Glad, M.D.  [facility]  Medical Director [facility] Medical Director [facility] Cardio-Pulmonary Department

## 2019-11-25 NOTE — Death Summary Note (Signed)
DEATH SUMMARY   Patient Details  Name: Angel Campbell MRN: [id number] DOB: 11/03/1970  Admission/Discharge Information   Admit Date:  2019/11/23  Date of Death: Date of Death: (P) 2019-11-29  Time of Death: Time of Death: (P) 2355/05/06  Length of Stay: 7  Referring Physician: Marygrace Drought, MD   Reason(s) for Hospitalization  COVID-19 Pneumonia Acute Hypoxic Respiratory Failure ARDS  Diagnoses  Preliminary cause of death:   COVID-19 Pneumonia Secondary Diagnoses (including complications and co-morbidities):  Principal Problem:   Pneumonia due to COVID-19 virus Active Problems:   DVT, bilateral lower limbs (HCC)   Obesity, Class III, BMI 40-49.9 (morbid obesity) ([place])   Acute respiratory failure due to COVID-19 [facility])   COPD (chronic obstructive pulmonary disease) ([place])   Hypokalemia   Brief Hospital Course (including significant findings, care, treatment, and services provided and events leading to death)  Angel Campbell is a 49 y.o. 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  She admits to having nausea and vomiting but denies having any diarrhea. She denies having any chest pain, diaphoresis, palpitations or any urinary symptoms. Patient is unvaccinated and has had exposure to someone with COVID-19 infection She took her Covid self test at home on 11/08/19 which was positive Upon arrival to the ER she is noted to have room air pulse oximetry of 64% and is currently on a nonrebreather mask at 15 L.  She was initially placed on 6 L with pulse oximetry between 85 to 90%. Arterial blood gas pH 7.4, PCO2 46, PO2 37 O2 sat 71 Sodium 132, potassium 3.3, chloride 96, bicarb 24, glucose 325, BUN 8, creatinine  0.72, alk phos 69, AST 66, ALT 45, LDH 485, ferritin 742, white count 3.5, hemoglobin 12.9, hematocrit 36.7, MCV 84.8, RDW 13.6, fibrinogen 551, fibrin derivatives 1359. Chest x-ray reviewed by me shows patchy bilateral airspace infiltrates consistent with multifocal pneumonia. Twelve-lead EKG reviewed by me shows sinus rhythm with PACs    ED Course:  Patient is a 49 year old female with a history of morbid obesity, venous thromboembolic disease and COPD who presents to the ER for evaluation of worsening shortness of breath.  She is unvaccinated and was exposed to someone with  COVID-19 viral infection.  She had a home test on 11/08/19 which was positive and her PCR test here in the ER is positive as well.  Upon arrival to the ER she had room air pulse oximetry of 64% and is currently on a nonrebreather mask and nasal cannula to maintain pulse oximetry greater than 92%.  Chest x-ray is consistent with Covid pneumonia.  She received a dose of Rocephin IV, Zithromax IV and 125 mg of Solu-Medrol in the ER.  She will be admitted to the hospital for further evaluation.  Hospital Course: On 11/11/19 she had worsening Hypoxia of which she required BiPAP and transfer to ICU.  PCCM was consulted due to potential need for intubation.  She continued to intermittently require BiPAP, but developed worsening Encephalopathy on Nov 29, 2022 requiring emergent intubation.  Pt's sister was updated, and she reported that her sister had previously stated and was adamant she WOULD NOT want to be placed on the ventilator.  Pt's family elected to transition to comfort measures, and withdraw all life sustaining treatments.  She  passed shortly after life support was withdrawn.  Pertinent Labs and Studies  Significant Diagnostic Studies DG Abd 1 View  Result Date: 11/02/2019 CLINICAL DATA:  Tube placement EXAM: ABDOMEN - 1 VIEW COMPARISON:  06/10/2019 abdominal CT FINDINGS: Image blurring from motion artifact at the level of the  stomach. There is available chest x-ray comparison. The enteric tube tip is at the stomach with flexion at the level of the side port which is at the proximal stomach. Where visualized no specific obstructive pattern or concerning mass effect/calcification. IMPRESSION: Enteric tube with tip at the mid stomach. Electronically Signed   By: Monte Fantasia M.D.   On: 11/05/2019 07:00   CT ANGIO CHEST PE W OR WO CONTRAST  Result Date: 11/05/2019 CLINICAL DATA:  COVID-19 positive, shortness of breath, hypoxic on room air EXAM: CT ANGIOGRAPHY CHEST WITH CONTRAST TECHNIQUE: Multidetector CT imaging of the chest was performed using the standard protocol during bolus administration of intravenous contrast. Multiplanar CT image reconstructions and MIPs were obtained to evaluate the vascular anatomy. CONTRAST:  160m OMNIPAQUE IOHEXOL 350 MG/ML SOLN COMPARISON:  Thyroid ultrasound 12/22/2013, CT angiography 03/29/2013 FINDINGS: Cardiovascular: Satisfactory opacification of the central pulmonary arteries. No central, lobar or proximal segmental filling defects are identified. More distal evaluation limited by extensive respiratory motion artifact. Central pulmonary arteries are upper limits normal caliber. Cardiac size is top normal. No pericardial effusion. The aortic root is suboptimally assessed given cardiac pulsation artifact. No acute luminal abnormality of the imaged aorta. No periaortic stranding or hemorrhage. Shared origin of the brachiocephalic and left common carotid arteries. Proximal great vessels are otherwise unremarkable. Mediastinum/Nodes: Enlarging left lobe thyroid gland which displaces the trachea rightward and splays the common carotid vessels, extending into the thoracic inlet. Gland measures up to 4.4 cm in size, previously 3.5 cm at a similar level. Likely comprised of a dominant nodule seen on comparison thyroid ultrasound (12/22/2013). No mediastinal fluid or gas. No acute abnormality of the  trachea or esophagus. No worrisome mediastinal, hilar or axillary adenopathy. Lungs/Pleura: Diffuse, heterogeneous mixed interstitial and ground-glass opacities present throughout both lungs with associated airways thickening compatible with typical reported imaging features of COVID-19. No pneumothorax. No effusion. Upper Abdomen: No acute abnormalities present in the visualized portions of the upper abdomen. Musculoskeletal: Multilevel degenerative changes are present in the imaged portions of the spine. No acute osseous abnormality or suspicious osseous lesion. Review of the MIP images confirms the above findings. IMPRESSION: 1. No evidence of central, lobar or proximal segmental pulmonary embolism. More distal evaluation limited by extensive respiratory motion artifact. 2. Diffuse, heterogeneous mixed interstitial and ground-glass opacities present throughout both lungs with associated airways thickening compatible with typical reported imaging features of COVID-19. 3. Interval enlargement of the previously evaluated heterogeneous left thyroid gland. Given interval change from the comparison study recommend outpatient thyroid ultrasound (reference: J Am Coll Radiol. 2015 Feb;12(2): 143-50). Electronically Signed   By: PLovena LeM.D.   On: 11/04/2019 15:29   Portable Chest x-ray  Result Date: 922-Sep-2021CLINICAL DATA:  Intubation and OG tube placement. EXAM: PORTABLE CHEST 1 VIEW COMPARISON:  11/13/2019. FINDINGS: Endotracheal tube noted with tip 5 cm above the carina. NG tube noted with tip below left hemidiaphragm. Faint lucency again noted over the left heart border. Tiny pneumomediastinum again cannot be excluded. Heart size stable. Low lung volumes. Diffuse bilateral pulmonary infiltrates again noted without interim change. No pleural effusion or pneumothorax. IMPRESSION: 1. Endotracheal tube noted with tip 5 cm above the carina. NG  tube noted with tip below left hemidiaphragm. 2. Faint lucency again  noted over the left heart border. Tiny pneumomediastinum again cannot be excluded. 3. Low lung volumes. Diffuse bilateral infiltrates again noted without interim change. Electronically Signed   By: Marcello Moores  Register   On: 2019/11/26 07:02   DG Chest Port 1 View  Result Date: 11/13/2019 CLINICAL DATA:  Acute respiratory failure. EXAM: PORTABLE CHEST 1 VIEW COMPARISON:  11/12/2019. FINDINGS: Unchanged cardiac enlargement. Thin lucency along the superior left heart border is identified. Cannot rule out pneumomediastinum. Unchanged appearance of diffuse interstitial airspace opacities compatible with IMPRESSION: 1. Subtle lucency along the superior left heart border is identified which may be seen with pneumomediastinum. 2. No change in the appearance of diffuse interstitial and airspace densities compatible with COVID pneumonia. 3. These results will be called to the ordering clinician or representative by the Radiologist Assistant, and communication documented in the PACS or [employer]. Electronically Signed   By: Kerby Moors M.D.   On: 11/13/2019 08:24   DG Chest Port 1 View  Result Date: 11/12/2019 CLINICAL DATA:  Acute respiratory failure with hypoxia. COVID positive EXAM: PORTABLE CHEST 1 VIEW COMPARISON:  11/12/2019 FINDINGS: Mild cardiac enlargement. Decreased lung volumes. Diffuse bilateral interstitial and airspace densities appear unchanged from previous exam. No new findings. IMPRESSION: 1. No change in aeration to the lungs compared to previous exam. 2. Stable appearance of bilateral interstitial and airspace densities. Electronically Signed   By: Kerby Moors M.D.   On: 11/12/2019 06:11   DG Chest Port 1 View  Result Date: 11/08/2019 CLINICAL DATA:  Shortness of, hypoxia, COVID-19 positive on home test, COPD EXAM: PORTABLE CHEST 1 VIEW COMPARISON:  Portable exam 1211 hours compared to 03/29/2013 FINDINGS: Upper normal heart size. Mediastinal contours normal. Patchy BILATERAL airspace  infiltrates consistent with multifocal pneumonia. No pleural effusion, pneumothorax or acute osseous findings. IMPRESSION: Patchy BILATERAL airspace infiltrates consistent with multifocal pneumonia. Electronically Signed   By: Lavonia Dana M.D.   On: 11/21/2019 12:18   [place] EKG SITE RITE  Result Date: 11/26/19 If Site Rite image not attached, placement could not be confirmed due to current cardiac rhythm.  [place] EKG SITE RITE  Result Date: 2019-11-26 If Site Rite image not attached, placement could not be confirmed due to current cardiac rhythm.   Microbiology Recent Results (from the past 240 hour(s))  SARS Coronavirus 2 by RT PCR (hospital order, performed in [facility] hospital lab) Nasopharyngeal Nasopharyngeal Swab     Status: Abnormal   Collection Time: 11/04/2019 12:08 PM   Specimen: Nasopharyngeal Swab  Result Value Ref Range Status   SARS Coronavirus 2 POSITIVE (A) NEGATIVE Final    Comment: RESULT CALLED TO, READ BACK BY AND VERIFIED WITH: CHRISTINE HALLAS @1348  11/12/2019 MJU (NOTE) SARS-CoV-2 target nucleic acids are DETECTED  SARS-CoV-2 RNA is generally detectable in upper respiratory specimens  during the acute phase of infection.  Positive results are indicative  of the presence of the identified virus, but do not rule out bacterial infection or co-infection with other pathogens not detected by the test.  Clinical correlation with patient history and  other diagnostic information is necessary to determine patient infection status.  The expected result is negative.  Fact Sheet for Patients:   StrictlyIdeas.no   Fact Sheet for Healthcare Providers:   BankingDealers.co.za    This test is not yet approved or cleared by the [place] FDA and  has been authorized for detection and/or diagnosis of SARS-CoV-2 by FDA  under an Emergency Use Authorization (EUA).  This EUA will remain in effect (meaning this te st can be used) for  the duration of  the COVID-19 declaration under Section 564(b)(1) of the Act, 21 U.S.C. section 360-bbb-3(b)(1), unless the authorization is terminated or revoked sooner.  Performed at [facility], [address] [place], [place] [address]     Lab Basic Metabolic Panel: Recent Labs  Lab 11/10/19 [phone] 11/10/19 0342 11/11/19 0539 11/12/19 0718 11/13/19 0734 11/14/19 0532 November 16, 2019 0651  NA 138   < > 139 139 138 140 137  K 4.0   < > 4.3 4.2 3.7 3.2* 3.8  CL 103   < > 101 101 99 98 100  CO2 25   < > 28 27 29 30 27   GLUCOSE 282*   < > 232* 252* 247* 94 176*  BUN 15   < > 20 20 20  22* 20  CREATININE 0.59   < > 0.53 0.74 0.56 0.65 0.62  CALCIUM 7.9*   < > 8.3* 8.0* 8.2* 8.0* 7.9*  MG 2.4  --  2.5* 2.4 2.4 2.4  --   PHOS 2.9  --  2.6 3.7 3.2 3.0  --    < > = values in this interval not displayed.   Liver Function Tests: Recent Labs  Lab 11/10/19 0342 11/11/19 0539 11/12/19 0718 11/13/19 0734 11/14/19 0532  AST 60* 57* 47* 36 46*  ALT 40 35 32 29 31  ALKPHOS 68 69 77 79 79  BILITOT 0.7 0.7 0.8 0.8 0.9  PROT 7.0 7.1 6.9 7.1 7.3  ALBUMIN 3.0* 3.0* 2.9* 3.0* 3.1*   No results for input(s): LIPASE, AMYLASE in the last 168 hours. No results for input(s): AMMONIA in the last 168 hours. CBC: Recent Labs  Lab 11/10/19 0342 11/11/19 0539 11/12/19 0718 11/13/19 0734 11/14/19 0532  WBC 3.9* 7.7 7.0 8.6 10.4  NEUTROABS 3.1 6.7 5.9 7.1 8.4*  HGB 9.6* 13.3 13.6 14.5 14.8  HCT 29.0* 39.2 40.7 42.7 44.3  MCV 88.7 86.9 86.8 85.6 87.7  PLT 263 278 323 334 355   Cardiac Enzymes: No results for input(s): CKTOTAL, CKMB, CKMBINDEX, TROPONINI in the last 168 hours. Sepsis Labs: Recent Labs  Lab 11/01/2019 1208 11/10/19 0342 11/11/19 0539 11/12/19 0718 11/13/19 0734 11/14/19 0532 11/03/2019 0651  PROCALCITON 0.21  --   --   --   --   --  0.11  WBC 3.5*   < > 7.7 7.0 8.6 10.4  --    < > = values in this interval not displayed.    Procedures/Operations   Endotracheal intubation 3/88/82 Right Basilic PICC Line 8/00     Darel Hong, [place] [facility] Pager: [phone]    Bradly Bienenstock 11/08/2019, 12:40 AM

## 2019-11-25 NOTE — Progress Notes (Signed)
Pt extubated to room air per MD order. RN at bedside. Family and chaplain at door.

## 2019-11-25 NOTE — Progress Notes (Signed)
Patient got worse early morning and required urgent intubation.  Received a message from intensivist that they will assume care of the patient as primary attendings.  Hospitalist service will sign off.

## 2019-11-25 NOTE — Progress Notes (Signed)
Peripherally Inserted Central Catheter Placement  The IV Nurse has discussed with the patient and/or persons authorized to consent for the patient, the purpose of this procedure and the potential benefits and risks involved with this procedure.  The benefits include less needle sticks, lab draws from the catheter, and the patient may be discharged home with the catheter. Risks include, but not limited to, infection, bleeding, blood clot (thrombus formation), and puncture of an artery; nerve damage and irregular heartbeat and possibility to perform a PICC exchange if needed/ordered by physician.  Alternatives to this procedure were also discussed.  Bard Power PICC patient education guide, fact sheet on infection prevention and patient information card has been provided to patient /or left at bedside.    PICC Placement Documentation  PICC Triple Lumen 11/01/2019 PICC Right Basilic 43 cm 0 cm (Active)  Indication for Insertion or Continuance of Line Vasoactive infusions 11/05/2019 1604  Exposed Catheter (cm) 0 cm 11/01/2019 1604  Site Assessment Clean;Dry;Intact 11/13/2019 1604  Lumen #1 Status Flushed;Saline locked;Blood return noted 11/03/2019 1604  Lumen #2 Status Flushed;Saline locked;Blood return noted 11/04/2019 1604  Lumen #3 Status Flushed;Saline locked;Blood return noted 11/03/2019 1604  Dressing Type Transparent 11/17/2019 1604  Dressing Status Clean;Dry;Intact 11/06/2019 1604  Antimicrobial disc in place? Not Applicable 11/24/2019 1604  Dressing Intervention New dressing 11/21/2019 1604  Dressing Change Due 11/22/19 11/06/2019 1604       Ethelda Chick 11/13/2019, 4:06 PM

## 2019-11-25 NOTE — Consult Note (Signed)
ANTICOAGULATION CONSULT NOTE  Pharmacy Consult for Warfarin Indication: History of VTE  Patient Measurements: Height: 5\' 9"  (175.3 cm) Weight: 126.4 kg (278 lb 10.6 oz) IBW/kg (Calculated) : 66.2  Vital Signs: Temp: 99.3 F (37.4 C) (09/21 1400) Temp Source: Esophageal (09/21 0900) BP: 97/65 (09/21 1400) Pulse Rate: 84 (09/21 1400)  Labs: Recent Labs    11/13/19 0734 11/14/19 0532 Dec 04, 2019 0651  HGB 14.5 14.8  --   HCT 42.7 44.3  --   PLT 334 355  --   LABPROT 24.6* 27.1* 25.5*  INR 2.3* 2.6* 2.4*  CREATININE 0.56 0.65 0.62    Estimated Creatinine Clearance: 118.6 mL/min (by C-G formula based on SCr of 0.62 mg/dL).   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 Pharmacy has been consulted to manage warfarin while inpatient.   Hgb 12.9 dropped to 9.6 on 9/16; no apparent signs/symptoms of bleeding. 9/17 Hgb recovered to 13.3. MD is okay to continue warfarin today and also starting her on therapeutic lovenox, which has been discontinued following therapeutic INRs.  Date INR Plan  9/14 Unk Last home dose  9/15 1.6 Not resumed  9/16 1.8 Hold  9/17 1.4 10 mg  9/18 2.0 5 mg  9/19 2.3 5 mg  9/20 2.6 3 mg  9/21 2.4     Goal of Therapy:  INR 2-3   Plan:  Continue with home dose of warfarin 5 mg tonight.  Pharmacy will continue to follow.   10/21, PharmD 2019-12-04 2:59 PM

## 2019-11-25 NOTE — Progress Notes (Addendum)
CSW received consult due to patient being worried about her son before intubation. Per consult, patient's son is 37 with IDD and she was worried about him being kicked out of the motel he was residing in. Also was asked by MD to find a family contact person for patient due to patient's medical status.  Name of hotel was not obtained prior to intubating patient.   CSW did chart review including Care Everywhere, tried phone numbers listed, unable to reach son or find any other family contacts.   RN looked at patient's phone. CSW tried to call numbers patient had missed calls from, they were automated lines for car warranties.  Patient did have Facebook messages from her son, RN and CSW unable to open messages or phone due to it being locked.   Per [facility], CSW called APS for follow up to make sure son is safe and ask him to call the hospital if they are able to get in touch with him.  Charge RN to monitor patient's phone and answer if son calls to try to get his contact information or information for other family contacts.  Per [facility] Leadership, if medical decisions need to be made and we still have not reached family at that time, would need Ethics Consult and/or 2 Physician rule would apply.       4:05- RN was able to talk with patient's sister Doreen Salvage [phone]). MD will call to give medical updates. Per Doreen Salvage, patient's son is safe at [facility]. CSW will update APS.      CSW will continue to follow.      Alfonso Ramus, [place] [phone]

## 2019-11-25 NOTE — Progress Notes (Signed)
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   Patient is unvaccinated and has had exposure to someone with COVID-19 infection  She took her Covidselftest at home on 09/14/21which was positive  Upon arrival to the ER she is noted to have room air pulse oximetry of 64%   She was initially placed on 6 L with pulse oximetry between 85 to 90%. Arterial blood gas pH 7.4, PCO2 46, PO2 37 O2 sat 71 Sodium 132, potassium 3.3, chloride 96, bicarb 24, glucose 325, BUN 8, creatinine 0.72, alk phos 69, AST 66, ALT 45, LDH 485, ferritin 742, white count 3.5, hemoglobin 12.9, hematocrit 36.7, MCV 84.8, RDW 13.6, fibrinogen 551, fibrin derivatives1359.   Chest x-ray reviewed by me shows patchy bilateral airspace infiltrates consistent with multifocal pneumonia. Twelve-lead EKG reviewed by me shows sinus rhythm with PACs    ED Course:She had a home test on 09/14/21which was positive and her PCR test here in the ER is positive as well.Upon arrival to the ER she had room air pulse oximetry of 64% and is currently on a nonrebreather mask and nasal cannula to maintain pulse oximetry greater than 92%.Chest x-ray is consistent with Covid pneumonia.She received a dose of Rocephin IV, Zithromax IV and 125 mg of Solu-Medrol in the ER. She will be admitted to the hospital for further evaluation.  SIGNIFICANT EVENTS 9/15 admitted for COVID pneumonia 9/16 started on biPAP 9/17 transfererd to ICU for increased WOB and SOB, on biPAP 9/19 Received dose lasix 9/20  Received dose Lasix, on [facility] 9/21 Placed back on BiPAP due Hypoxia & increased WOB; High risk for intubation 9/21 Worsening Encephalopathy, removed BiPAP multiple times with severe Hypoxia; Intubated    BP 96/78   Pulse 75   Temp 99.2 F (37.3 C) (Axillary)   Resp (!) 33   Ht _0  (1.753 m)   Wt 126.4 kg   SpO2 98%   BMI 41.15 kg/m    I/O last 3 completed shifts: In: 440 [P.O.:400; I.V.:40] Out: 2600 [Urine:2600] No intake/output data recorded.  SpO2: 98 % O2 Flow Rate (L/min): 50 L/min FiO2 (%): 70 %  Estimated body mass index is 41.15 kg/m as calculated from the following:   Height as of this encounter: _1  (1.753 m).   Weight as of this encounter: 126.4 kg.   Review of Systems:  Unable to assess due to AMS  SUBJECTIVE Unable to assess due to AMS   PHYSICAL EXAMINATION:  GENERAL:Critically ill appearing female, sitting in bed, on BiPAP, with moderate respiratory distress PULMONARY: Coarse breath sounds bilaterally, moderate respiratory distress CARDIOVASCULAR: Tachycardia, regular rhythm, s1s2, no M/R/G GASTROINTESTINAL: Obese, soft, nontender, nondistended, no guarding or rebound tenderness, BS+ x4   MUSCULOSKELETAL: Normal bulk and tone, no deformities, no edema NEUROLOGIC: Awake, confused, impulsive, will follow commands SKIN: Warm and dry.  No obvious rashes, lesions, or ulcerations  CBC    Component Value Date/Time   WBC 10.4 11/14/2019 0532   RBC 5.05 11/14/2019 0532   HGB 14.8 11/14/2019 0532   HGB 12.6 07/30/2013 0425   HCT 44.3 11/14/2019 0532   HCT 38.0 07/30/2013 0425  PLT 355 11/14/2019 0532   PLT 282 07/30/2013 0425   MCV 87.7 11/14/2019 0532   MCV 90 07/30/2013 0425   MCH 29.3 11/14/2019 0532   MCHC 33.4 11/14/2019 0532   RDW 13.3 11/14/2019 0532   RDW 14.3 07/30/2013 0425   LYMPHSABS 1.2 11/14/2019 0532   LYMPHSABS 1.7 07/30/2013 0425   MONOABS 0.4 11/14/2019 0532   MONOABS 0.4 07/30/2013 0425   EOSABS 0.0 11/14/2019 0532    EOSABS 0.2 07/30/2013 0425   BASOSABS 0.0 11/14/2019 0532   BASOSABS 0.1 07/30/2013 0425   BMP Latest Ref Rng & Units 11/14/2019 11/13/2019 11/12/2019  Glucose 70 - 99 mg/dL 94 247(H) 252(H)  BUN 6 - 20 mg/dL 22(H) 20 20  Creatinine 0.44 - 1.00 mg/dL 0.65 0.56 0.74  Sodium 135 - 145 mmol/L 140 138 139  Potassium 3.5 - 5.1 mmol/L 3.2(L) 3.7 4.2  Chloride 98 - 111 mmol/L 98 99 101  CO2 22 - 32 mmol/L _0 Calcium 8.9 - 10.3 mg/dL 8.0(L) 8.2(L) 8.0(L)    MEDICATIONS: I have reviewed all medications and confirmed regimen as documented   CULTURE RESULTS   Recent Results (from the past 240 hour(s))  SARS Coronavirus 2 by RT PCR (hospital order, performed in [facility] hospital lab) Nasopharyngeal Nasopharyngeal Swab     Status: Abnormal   Collection Time: 11/18/2019 12:08 PM   Specimen: Nasopharyngeal Swab  Result Value Ref Range Status   SARS Coronavirus 2 POSITIVE (A) NEGATIVE Final    Comment: RESULT CALLED TO, READ BACK BY AND VERIFIED WITH: CHRISTINE HALLAS _1  11/11/2019 MJU (NOTE) SARS-CoV-2 target nucleic acids are DETECTED  SARS-CoV-2 RNA is generally detectable in upper respiratory specimens  during the acute phase of infection.  Positive results are indicative  of the presence of the identified virus, but do not rule out bacterial infection or co-infection with other pathogens not detected by the test.  Clinical correlation with patient history and  other diagnostic information is necessary to determine patient infection status.  The expected result is negative.  Fact Sheet for Patients:   StrictlyIdeas.no   Fact Sheet for Healthcare Providers:   BankingDealers.co.za    This test is not yet approved or cleared by the [place] FDA and  has been authorized for detection and/or diagnosis of SARS-CoV-2 by FDA under an Emergency Use Authorization (EUA).  This EUA will remain in effect (meaning this te st can be used)  for the duration of  the COVID-19 declaration under Section 564(b)(1) of the Act, 21 U.S.C. section 360-bbb-3(b)(1), unless the authorization is terminated or revoked sooner.  Performed at [facility], [address], [place],  [address]           IMAGING    No results found.   Nutrition Status:            ASSESSMENT AND PLAN SYNOPSIS  Acute hypoxemic respiratory failure due to COVID-19 pneumonia / ARDS Chronic COPD without acute exacerbation Morbid obesity, possible OSA.  -Failed trial of BiPAP due to encephalopathy -Mechanical ventilation via ARDS protocol, target PRVC 6 cc/kg -Wean PEEP and FiO2 as able to maintain O2 sats >88% -Goal plateau pressure less than 30, driving pressure less than 15 -Paralytics if necessary for vent synchrony, gas exchange -Cycle prone positioning if necessary for oxygenation -Deep sedation per PAD protocol, goal RASS -4, currently fentanyl, midazolam -Diuresis as blood pressure and renal function can tolerate, goal CVP 5-8.   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 -Continuous cardiac monitoring -Maintain MAP >65 -INR now therapeutic -Lovenox d/c and continue with Coumadin   Newly diagnosed Diabetes Mellitus Type II -CBG's -SSI -Follow ICU Hypo/Hyperglycemia  protocol   ELECTROLYTES Hypokalemia -Monitor I&O's / urinary output -Follow BMP -Ensure adequate renal perfusion -Avoid nephrotoxic agents as able -Replace electrolytes as indicated -Pharmacy consultation and following    DVT/GI PRX ordered and assessed TRANSFUSIONS AS NEEDED MONITOR FSBS   Pt's prognosis is guarded, she is at high risk for cardiac arrest and death   BEST PRACTICES DISPOSITION: ICU GOALS OF CARE: Full code VTE PROPHYLAXIS: Warfarin SUP: Pepcid UPDATES: Updated pt at bedside 11-20-2019.  CAlled to update pt's son via telephone but no answer.

## 2019-11-25 DEATH — deceased

## 2020-09-19 IMAGING — CT CT RENAL STONE PROTOCOL
2 of 4 series · 15 of 46 positions shown, 17 images · non-contrast
Comparison: No priors.

CLINICAL DATA: 48-year-old female with history of flank pain and
dark urine. Suspected kidney stone.

EXAM:
CT ABDOMEN AND PELVIS WITHOUT CONTRAST
TECHNIQUE: Multidetector CT imaging of the abdomen and pelvis was performed
following the standard protocol without IV contrast.

[Series 2: stone full standard · axial · 0.90mm/px · z∈[-942,-497]mm · 12 of 103 slices shown, 14 images]
[im 9/103  soft-tissue]
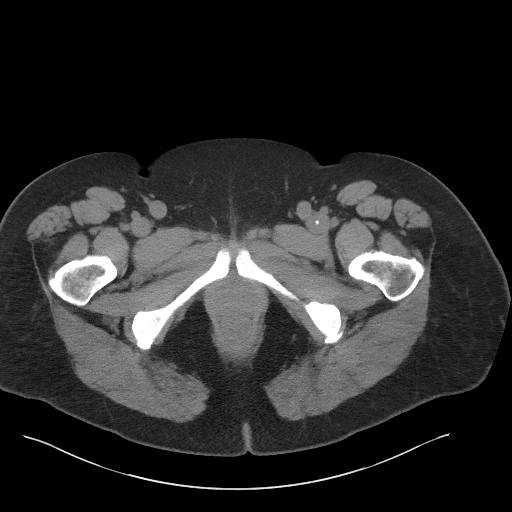
[im 9/103  bone]
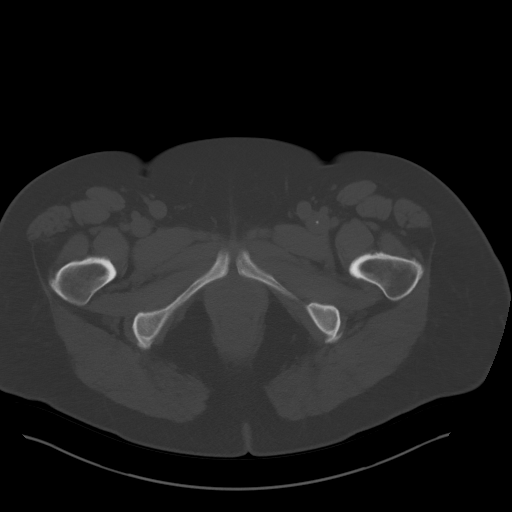
[im 17/103  soft-tissue]
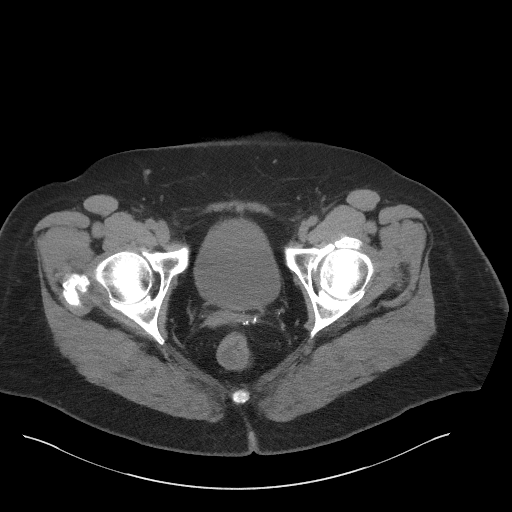
[im 25/103  soft-tissue]
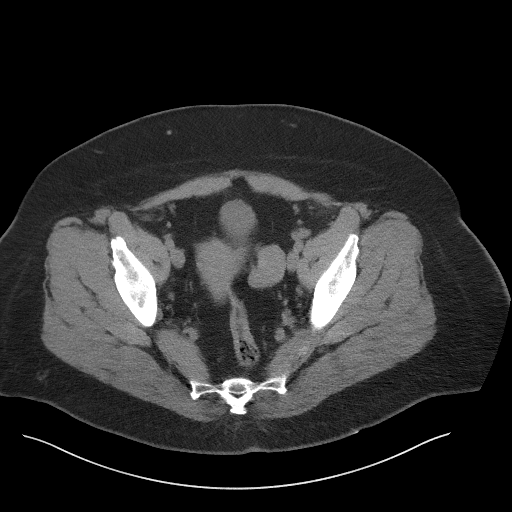
[im 33/103  soft-tissue]
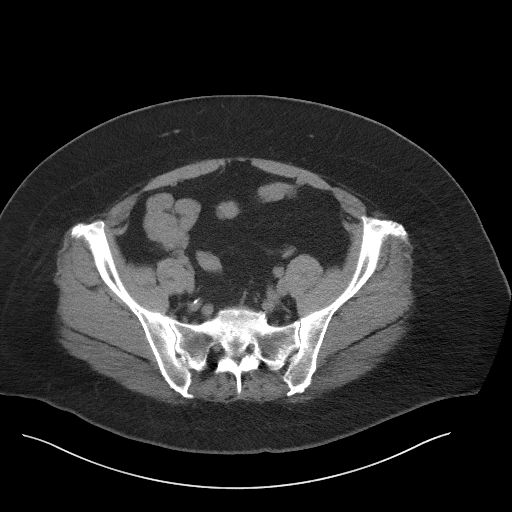
[im 41/103  soft-tissue]
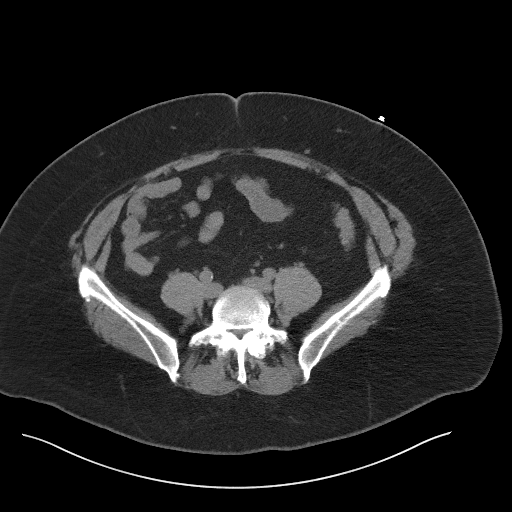
[im 49/103  soft-tissue]
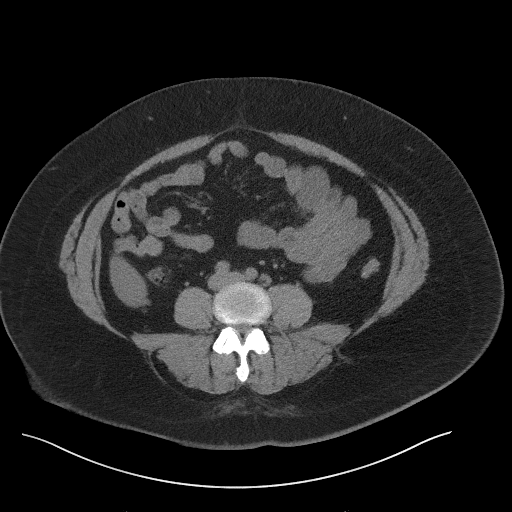
[im 58/103  soft-tissue]
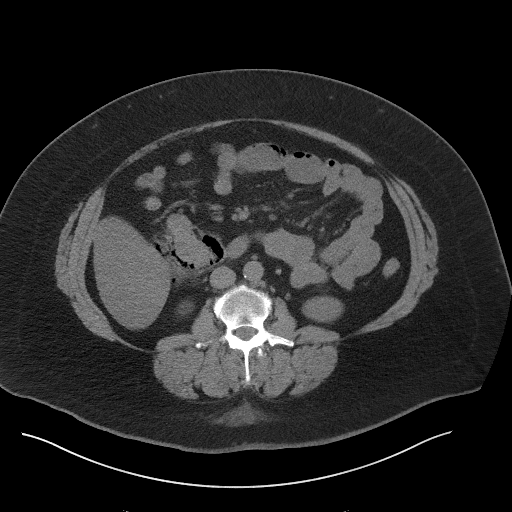
[im 66/103  soft-tissue]
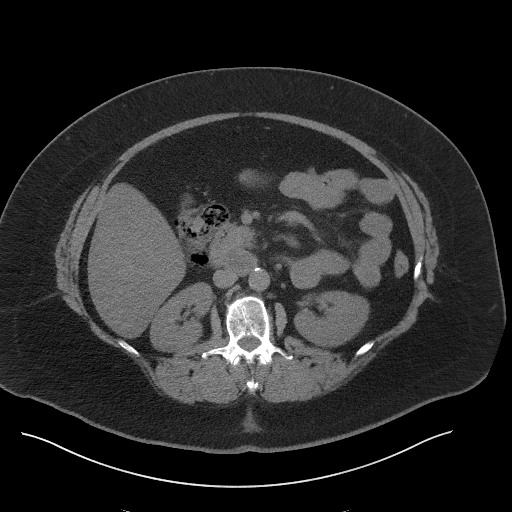
[im 74/103  soft-tissue]
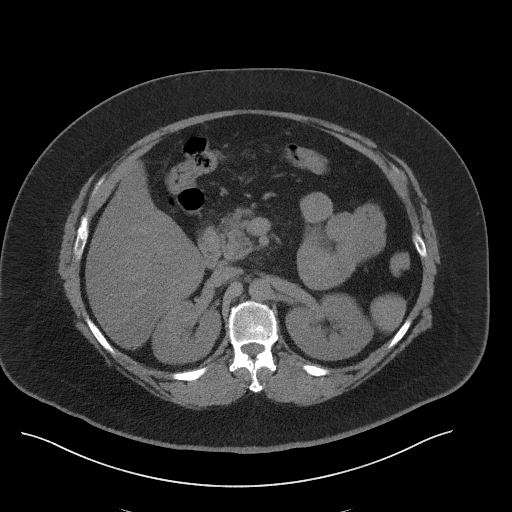
[im 74/103  bone]
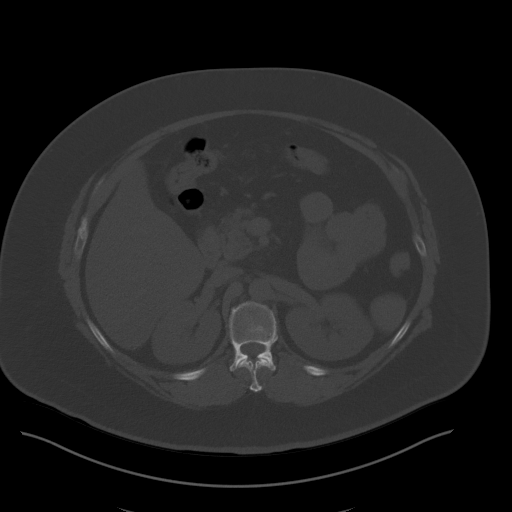
[im 82/103  soft-tissue]
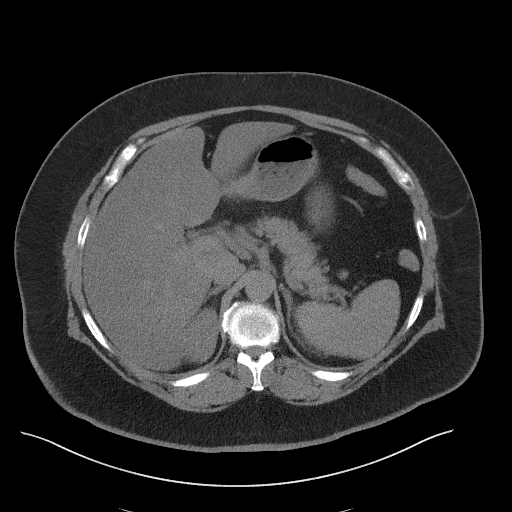
[im 90/103  soft-tissue]
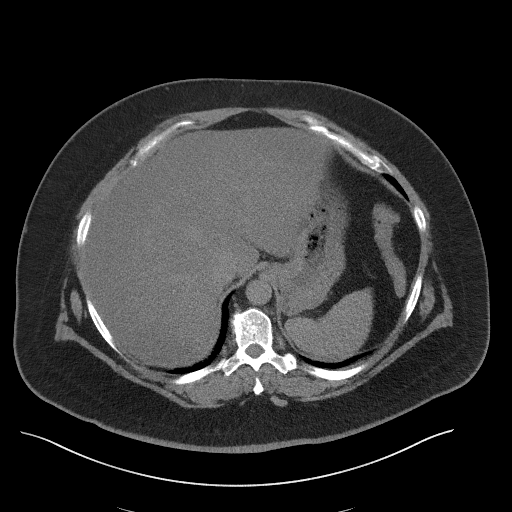
[im 98/103  soft-tissue]
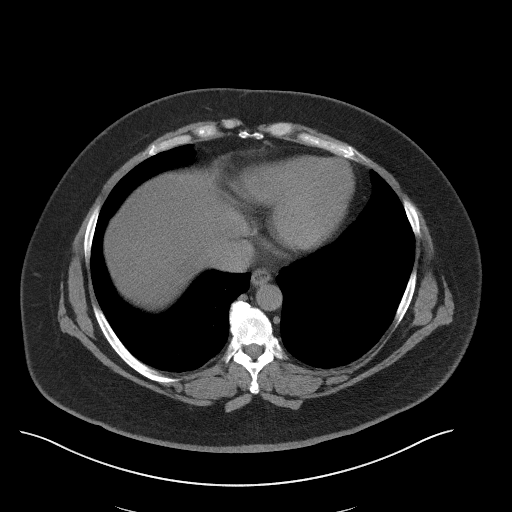

[Series 5: coronal · coronal · 0.90mm/px · 3 of 169 slices shown]
[im 57/169  soft-tissue]
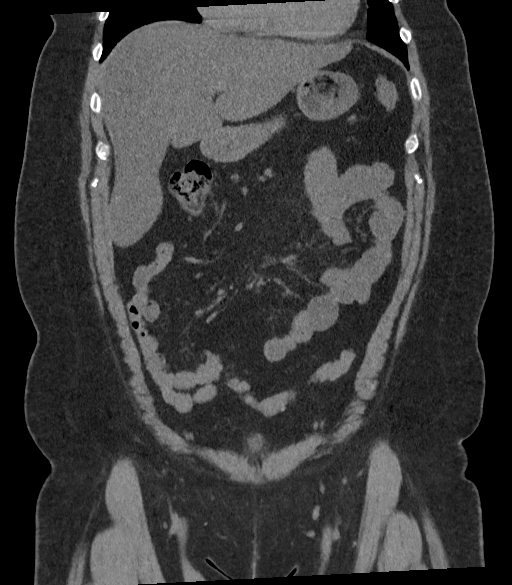
[im 75/169  soft-tissue]
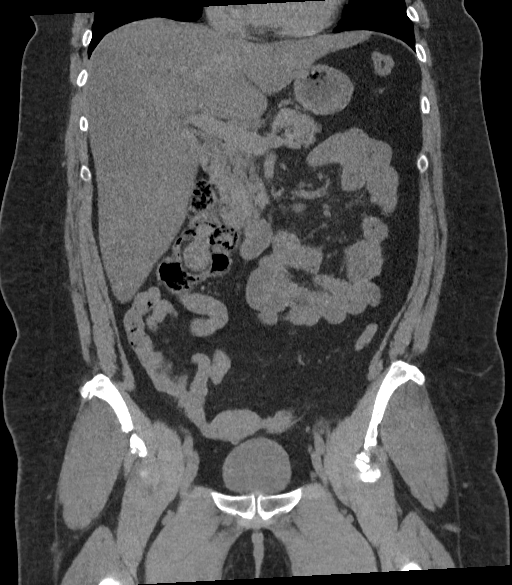
[im 94/169  soft-tissue]
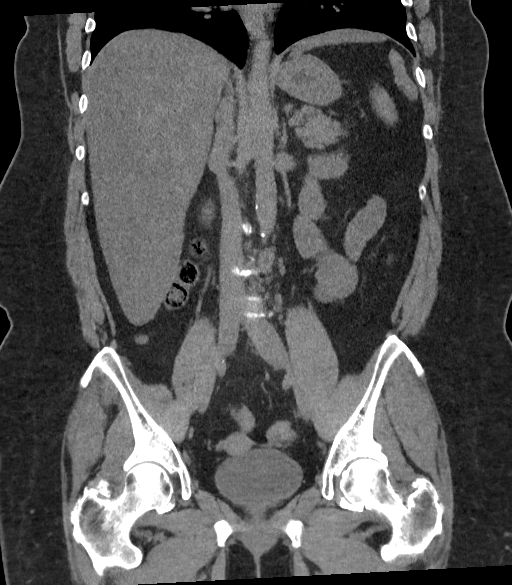

[15 of 46 positions shown; findings below may reference images not displayed]

FINDINGS: Lower chest: Unremarkable.

Hepatobiliary: Diffuse low attenuation throughout the hepatic
parenchyma, indicative of severe hepatic steatosis. Increased
craniocaudal span of the liver measuring 26.2 cm, predominantly
involving the right lobe of the liver, likely a Riedel's lobe
variant. No definite cystic or solid hepatic lesions are confidently
identified on today's noncontrast CT examination. Status post
cholecystectomy.

Pancreas: No definite pancreatic mass or peripancreatic fluid
collections or inflammatory changes are noted on today's noncontrast
CT examination.

Spleen: Unremarkable.

Adrenals/Urinary Tract: There are no abnormal calcifications within
the collecting system of either kidney, along the course of either
ureter, or within the lumen of the urinary bladder. No
hydroureteronephrosis or perinephric stranding to suggest urinary
tract obstruction at this time. The unenhanced appearance of the
kidneys is unremarkable bilaterally. Unenhanced appearance of the
urinary bladder is normal. Bilateral adrenal glands are normal in
appearance.

Stomach/Bowel: Unenhanced appearance of the stomach is normal. No
pathologic dilatation of small bowel or colon. Normal appendix.

Vascular/Lymphatic: Aortic atherosclerosis, without evidence of
aneurysm or dissection in the abdominal or pelvic vasculature. No
lymphadenopathy noted in the abdomen or pelvis.

Reproductive: Uterus and right ovary are unremarkable in appearance.
In the left adnexa there is a well-circumscribed solid appearing
intermediate attenuation (51 HU) lesion measuring 4.0 x 2.5 x 3.9 cm
(axial image 77 of series 2 and coronal image 84 of series 5),
presumably an ovarian lesion, but incompletely characterized on
today's noncontrast CT examination.

Other: No significant volume of ascites.  No pneumoperitoneum.

Musculoskeletal: There are no aggressive appearing lytic or blastic
lesions noted in the visualized portions of the skeleton.
IMPRESSION: 1. No acute findings are noted in the abdomen or pelvis to account
for the patient's symptoms. Specifically, no urinary tract calculi
no findings of urinary tract obstruction.
2. Severe hepatic steatosis.
3. 4.0 x 2.5 x 3.9 cm solid appearing left adnexal lesion,
presumably of ovarian origin. Further evaluation with nonemergent
outpatient pelvic ultrasound is recommended in the near future to
better characterize this finding and exclude neoplasm.
4. Aortic atherosclerosis.
5. Additional incidental findings, as above.

## 2021-02-24 IMAGING — DX DG CHEST 1V PORT
1 series · 1 of 1 positions shown · non-contrast
Comparison: 11/13/2019.

CLINICAL DATA: Intubation and OG tube placement.

EXAM:
PORTABLE CHEST 1 VIEW

[chest ap]
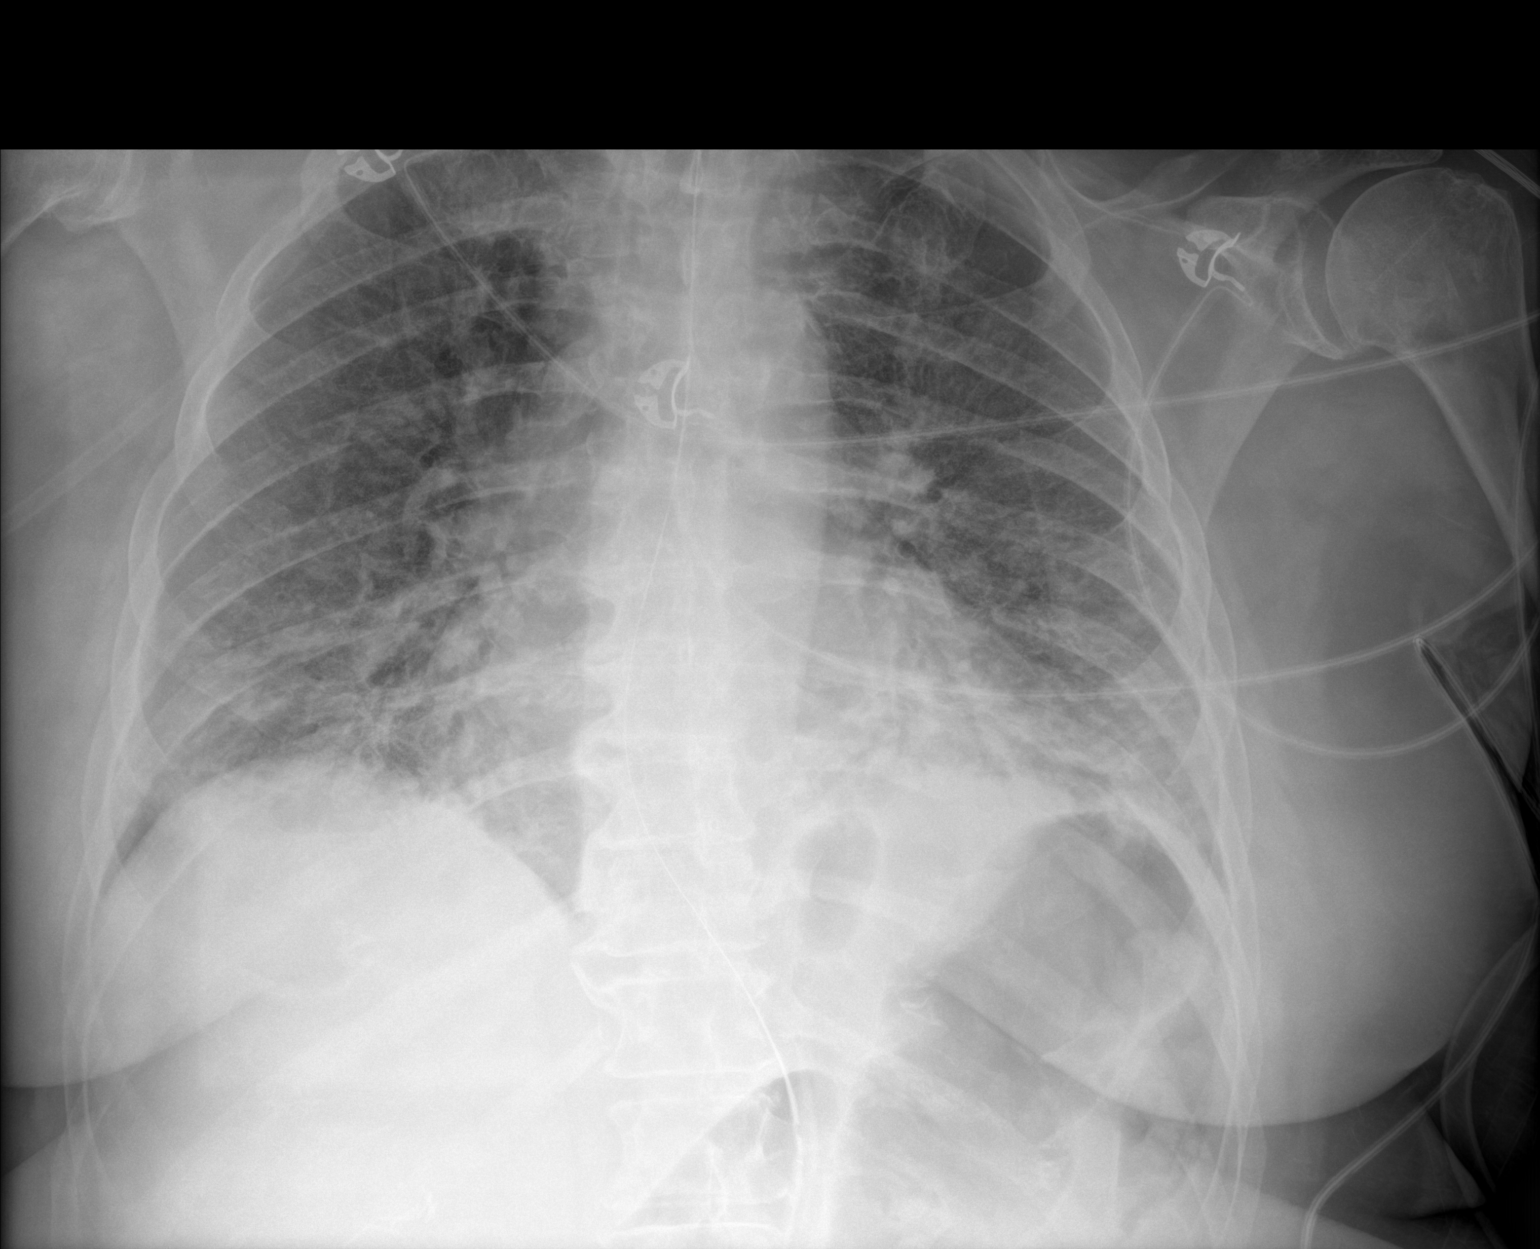

[1 of 1 positions shown; findings below may reference images not displayed]

FINDINGS: Endotracheal tube noted with tip 5 cm above the carina. NG tube
noted with tip below left hemidiaphragm. Faint lucency again noted
over the left heart border. Tiny pneumomediastinum again cannot be
excluded. Heart size stable. Low lung volumes. Diffuse bilateral
pulmonary infiltrates again noted without interim change. No pleural
effusion or pneumothorax.
IMPRESSION: 1. Endotracheal tube noted with tip 5 cm above the carina. NG tube
noted with tip below left hemidiaphragm.

2. Faint lucency again noted over the left heart border. Tiny
pneumomediastinum again cannot be excluded.

3. Low lung volumes. Diffuse bilateral infiltrates again noted
without interim change.

## 2021-02-24 IMAGING — DX DG ABDOMEN 1V
1 series · 1 of 1 positions shown · non-contrast
Comparison: 06/10/2019 abdominal CT

CLINICAL DATA: Tube placement

EXAM:
ABDOMEN - 1 VIEW

[abdomen supine]
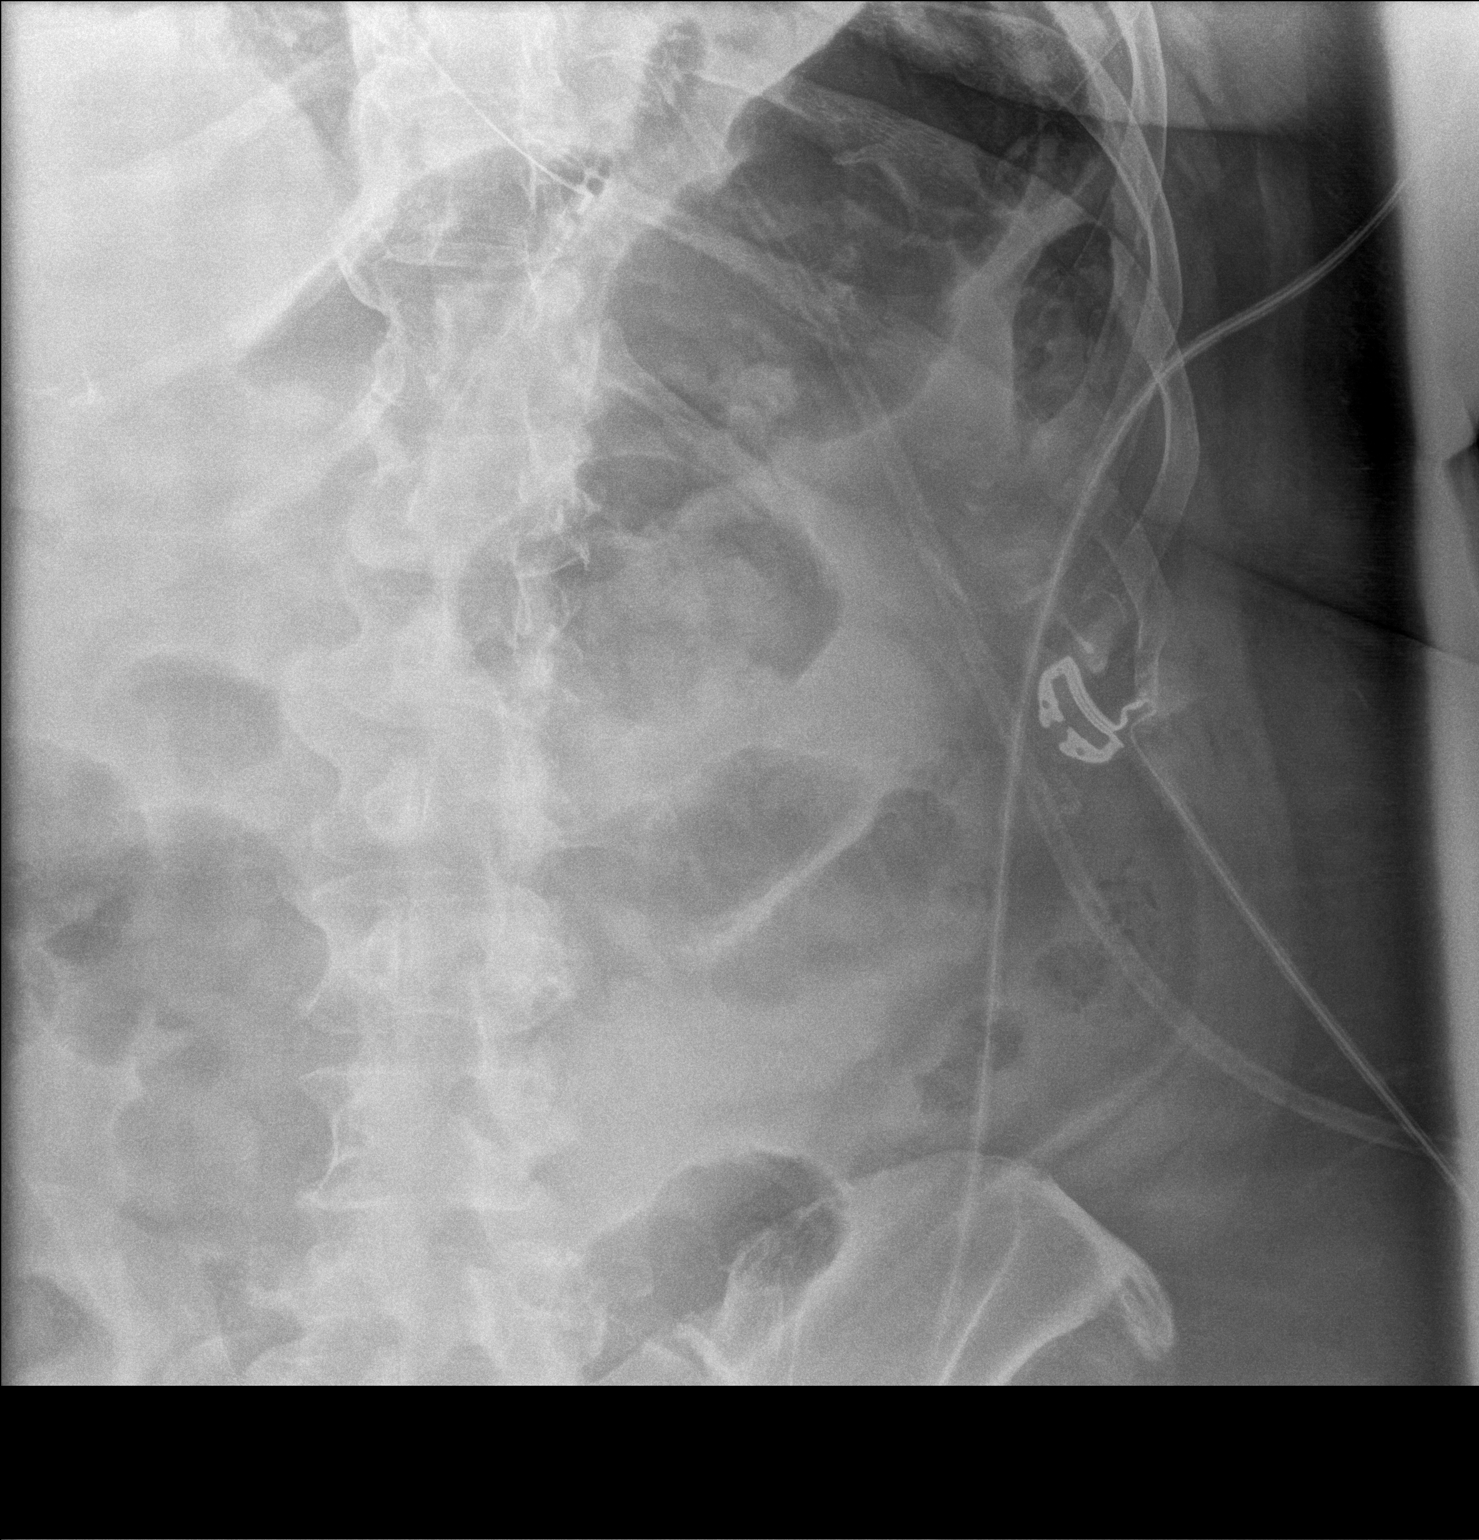

[1 of 1 positions shown; findings below may reference images not displayed]

FINDINGS: Image blurring from motion artifact at the level of the stomach.
There is available chest x-ray comparison. The enteric tube tip is
at the stomach with flexion at the level of the side port which is
at the proximal stomach. Where visualized no specific obstructive
pattern or concerning mass effect/calcification.
IMPRESSION: Enteric tube with tip at the mid stomach.
